# Patient Record
Sex: Male | Born: 1935 | Race: White | Hispanic: No | Marital: Married | Smoking: Former smoker
Health system: Southern US, Community
[De-identification: ages and names within clinical notes are randomized; demographics above are authoritative.]

## PROBLEM LIST (undated history)

## (undated) DIAGNOSIS — M5136 Other intervertebral disc degeneration, lumbar region: Secondary | ICD-10-CM

## (undated) DIAGNOSIS — I10 Essential (primary) hypertension: Secondary | ICD-10-CM

## (undated) DIAGNOSIS — N400 Enlarged prostate without lower urinary tract symptoms: Secondary | ICD-10-CM

## (undated) DIAGNOSIS — M4800 Spinal stenosis, site unspecified: Secondary | ICD-10-CM

## (undated) DIAGNOSIS — R350 Frequency of micturition: Secondary | ICD-10-CM

## (undated) DIAGNOSIS — E785 Hyperlipidemia, unspecified: Secondary | ICD-10-CM

## (undated) DIAGNOSIS — Z9889 Other specified postprocedural states: Secondary | ICD-10-CM

## (undated) DIAGNOSIS — M51369 Other intervertebral disc degeneration, lumbar region without mention of lumbar back pain or lower extremity pain: Secondary | ICD-10-CM

## (undated) DIAGNOSIS — R351 Nocturia: Secondary | ICD-10-CM

## (undated) DIAGNOSIS — R112 Nausea with vomiting, unspecified: Secondary | ICD-10-CM

## (undated) DIAGNOSIS — N320 Bladder-neck obstruction: Secondary | ICD-10-CM

## (undated) DIAGNOSIS — R3915 Urgency of urination: Secondary | ICD-10-CM

## (undated) HISTORY — PX: JOINT REPLACEMENT: SHX530

## (undated) HISTORY — PX: TRANSURETHRAL RESECTION OF PROSTATE: SHX73

## (undated) HISTORY — PX: CATARACT EXTRACTION W/ INTRAOCULAR LENS  IMPLANT, BILATERAL: SHX1307

## (undated) HISTORY — PX: EYE SURGERY: SHX253

## (undated) HISTORY — DX: Essential (primary) hypertension: I10

## (undated) HISTORY — PX: BACK SURGERY: SHX140

---
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
## 1971-07-06 DIAGNOSIS — R112 Nausea with vomiting, unspecified: Secondary | ICD-10-CM

## 1971-07-06 HISTORY — DX: Nausea with vomiting, unspecified: R11.2

## 1971-07-06 HISTORY — PX: OTHER SURGICAL HISTORY: SHX169

## 1994-07-05 HISTORY — PX: HEMIARTHROPLASTY SHOULDER FRACTURE: SUR653

## 1999-10-24 ENCOUNTER — Ambulatory Visit: Admission: RE | Admit: 1999-10-24 | Discharge: 1999-10-24 | Payer: Self-pay | Admitting: Otolaryngology

## 2000-07-05 HISTORY — PX: KNEE ARTHROSCOPY: SUR90

## 2000-09-27 ENCOUNTER — Ambulatory Visit (HOSPITAL_COMMUNITY): Admission: RE | Admit: 2000-09-27 | Discharge: 2000-09-27 | Payer: Self-pay | Admitting: Gastroenterology

## 2001-08-29 ENCOUNTER — Encounter: Payer: Self-pay | Admitting: Orthopedic Surgery

## 2001-09-04 ENCOUNTER — Inpatient Hospital Stay (HOSPITAL_COMMUNITY): Admission: RE | Admit: 2001-09-04 | Discharge: 2001-09-06 | Payer: Self-pay | Admitting: Orthopedic Surgery

## 2001-09-04 HISTORY — PX: TOTAL KNEE ARTHROPLASTY: SHX125

## 2006-11-14 DIAGNOSIS — D126 Benign neoplasm of colon, unspecified: Secondary | ICD-10-CM

## 2006-11-14 DIAGNOSIS — K5730 Diverticulosis of large intestine without perforation or abscess without bleeding: Secondary | ICD-10-CM | POA: Insufficient documentation

## 2007-04-10 ENCOUNTER — Ambulatory Visit: Payer: Self-pay | Admitting: Gastroenterology

## 2008-01-10 ENCOUNTER — Telehealth: Payer: Self-pay | Admitting: Gastroenterology

## 2008-01-17 ENCOUNTER — Ambulatory Visit: Payer: Self-pay | Admitting: Gastroenterology

## 2008-01-24 ENCOUNTER — Ambulatory Visit: Payer: Self-pay | Admitting: Gastroenterology

## 2008-01-24 ENCOUNTER — Encounter: Payer: Self-pay | Admitting: Gastroenterology

## 2008-01-29 ENCOUNTER — Encounter: Payer: Self-pay | Admitting: Gastroenterology

## 2009-05-08 ENCOUNTER — Inpatient Hospital Stay (HOSPITAL_COMMUNITY): Admission: RE | Admit: 2009-05-08 | Discharge: 2009-05-10 | Payer: Self-pay | Admitting: Orthopedic Surgery

## 2009-05-08 HISTORY — PX: LUMBAR FUSION: SHX111

## 2009-05-24 DIAGNOSIS — E785 Hyperlipidemia, unspecified: Secondary | ICD-10-CM | POA: Diagnosis present

## 2009-05-24 DIAGNOSIS — D61818 Other pancytopenia: Secondary | ICD-10-CM | POA: Diagnosis present

## 2009-06-02 DIAGNOSIS — I10 Essential (primary) hypertension: Secondary | ICD-10-CM | POA: Diagnosis present

## 2009-09-16 ENCOUNTER — Ambulatory Visit (HOSPITAL_COMMUNITY): Admission: RE | Admit: 2009-09-16 | Discharge: 2009-09-16 | Payer: Self-pay | Admitting: Orthopedic Surgery

## 2010-10-07 LAB — CBC
Hemoglobin: 11.7 g/dL — ABNORMAL LOW (ref 13.0–17.0)
MCV: 87.4 fL (ref 78.0–100.0)
RBC: 3.93 MIL/uL — ABNORMAL LOW (ref 4.22–5.81)
WBC: 5.4 10*3/uL (ref 4.0–10.5)

## 2010-10-07 LAB — BASIC METABOLIC PANEL
Calcium: 9.4 mg/dL (ref 8.4–10.5)
Chloride: 108 mEq/L (ref 96–112)
Creatinine, Ser: 1.08 mg/dL (ref 0.4–1.5)
GFR calc Af Amer: >60 mL/min (ref >60)
Sodium: 140 mEq/L (ref 135–145)

## 2010-10-07 LAB — ABO/RH: ABO/RH(D): O POS

## 2010-10-07 LAB — TYPE AND SCREEN
ABO/RH(D): O POS
Antibody Screen: NEGATIVE

## 2010-11-17 NOTE — Assessment & Plan Note (Signed)
[facility]                         GASTROENTEROLOGY OFFICE NOTE   Dennis Griffin                      MRN:          [id number]  DATE:04/10/2007                            DOB:          1931-03-20    PROBLEM:  Abdominal bulging.   Mr. Dennis Griffin is a pleasant 77 year old male here for evaluation of above.  He notices a knot that forms in his abdomen with abdominal wall  contraction such as that which occurs when he sits up.  He is without  pain.  Recent colonoscopy in May 2008 by Dr. Dorena Cookey apparently  demonstrated a colon polyp which was removed.  Dennis Griffin has no other  GI complaints including change of bowel habits, nausea, pyrosis, melena  or hematochezia.   PAST MEDICAL HISTORY:  Apparently is unremarkable.   FAMILY HISTORY:  Noncontributory.   MEDICATIONS:  Zocor, Procar, Cardura.   HE HAS NO ALLERGIES.   He neither smokes nor drinks.  He is married and retired.   REVIEW OF SYSTEMS:  Was reviewed and is negative as under HPI.   His colonoscopy report is now available and demonstrated a pedunculated  interstitial polyp in the proximal ascending colon measuring 20-30 mm in  size.  A 7 mm polyp in rectal sigmoid was also identified and removed.  The former was a tubular villous adenoma and the latter a tubular  adenoma.   PHYSICAL EXAMINATION:  Pulse 88, blood pressure 150/68, weight 185.  HEENT: EOMI.  PERRLA.  Sclerae are anicteric.  Conjunctivae are pink.  NECK:  Supple without thyromegaly, adenopathy or carotid bruits.  CHEST:  Clear to auscultation and percussion without adventitious  sounds.  CARDIAC:  Regular rhythm; normal S1 S2.  There are no murmurs, gallops  or rubs.  ABDOMEN:  There is a bulging in the area of the linea alba with  abdominal wall contraction.  There is no frank hernia.  There are no  abdominal masses or organomegaly.  EXTREMITIES:  Full range of motion.  No cyanosis, clubbing or edema.  RECTAL:   Deferred.   IMPRESSION:  1. Colonic polyposis.  2. Laxity in the anterior abdominal wall.  No frank hernia is seen.   RECOMMENDATION:  1. Followup colonoscopy in May 2009.  2. Patient was instructed not to do heavy lifting or straining which      could predispose him to an abdominal wall hernia.     Dennis Griffin. Dennis Dice, MD,FACG  Electronically Signed    RDK/MedQ  DD: 04/10/2007  DT: 04/10/2007  Job #: [id number]   cc:   Dennis Griffin, M.D.  Dennis Mormon, PA, [facility]

## 2010-11-20 NOTE — H&P (Signed)
[place]. [facility]  Patient:    Dennis Griffin, Dennis Griffin Visit Number: [id number] MRN: [id number]          Service Type: Attending:  Georgena Spurling, M.D. Dictated by:   Jamelle Rushing, P.A. Adm. Date:  09/04/01                           History and Physical  DATE OF BIRTH:  1930-08-24  CHIEF COMPLAINT:  Left knee pain and difficulty with range of motion.  HISTORY OF PRESENT ILLNESS:  The patient is a 77 year old white male with progressively worsening left knee pain.  Patient initially had difficulty with range of motion and pain, was evaluated by Dr. Thurston Hole, had an arthroscopic debridement of his knee approximately one year ago with some slight improvement.  Despite the arthroscopy the patient continued to have worsening pain and difficulty with range of motion.  He did have Synvisc injections with some improvement of his discomfort but his problems continued to progress. The patient states he has deep, aching sensation with any type of weightbearing activity.  He has stiffness in his knee after periods of sitting in rest.  He has difficulty fully extending his knee, making lying in bed very difficult.  His pain is predominantly along the medial joint line with no radiation.  He does have pain at night.  He does have popping, catching, and swelling in the knee and his knee has given out at times.  Patient denies any previous injury and he does not use any assistive device.  X-rays reveal severe medial joint line arthritis with findings from arthroscope of significant chondromalacia and degenerative changes throughout the knee.  DRUG ALLERGIES:  PENICILLIN.  CURRENT MEDICATIONS: 1. Lipitor 10 mg p.o. q.d. 2. Cardura 8 mg p.o. q.d. 3. Diclofenac 75 mg p.o. b.i.d. - will stop on this date.  PREVIOUS MEDICAL HISTORY: 1. Hypercholesterolemia. 2. BPH. 3. 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  PAST SURGICAL  HISTORY: 1. Tonsillectomy in 1942. 2. Broken arm with ORIF in 1973. 3. Right shoulder hemiarthroplasty in 1996. 4. Prostate surgery unknown date. 5. Left knee arthroscopy in 2002.  Patient denies any complications with any of the above-mentioned surgical procedures.  SOCIAL HISTORY:  Patient is a 77 year old, healthy-appearing, slightly heavyset white male.  Denies any history of smoking or alcohol use.  He is married with two grown children.  He lives in a [place] house and he is a retired Counsellor.  FAMILY PHYSICIAN:  Dr. Lupe Carney at [phone].  FAMILY MEDICAL HISTORY:  Both mother and father are deceased of old age. Patient has one brother deceased at 71 years of age from cardiac complications.  Patient has two brothers alive with no significant medical issues but they both smoke.  REVIEW OF SYSTEMS:  Positive for partial upper dentures.  He does use glasses at all times.  Otherwise, review of systems is negative for any other sensory, respiratory, cardiac, GI, GU, hematologic, musculoskeletal, neurologic, or mental status issues.  PHYSICAL EXAMINATION:  VITAL SIGNS:  Height 5 feet 9 inches, weight 195 pounds.  Pulse 60 and regular, respirations 16, temperature 98.0, blood pressure 172/92.  GENERAL:  This is a healthy-appearing, well-developed white male, slightly heavyset.  He ambulates with a slight left-sided limp.  He was able to get himself on and off the exam table without much difficulty other than has to stand a second and loosen  his knee up on the left before ambulating off down the hall.  HEENT:  Head is normocephalic, atraumatic, nontender over maxillary or frontal sinuses.  Pupils equal, round, and reactive, accommodating to light. Extraocular movements intact.  Sclerae are not icteric.  Conjunctivae are pink and moist.  External ears without deformities, canals patent, TMs pearly gray and intact.  Gross hearing is intact.  Nasal septum was slightly  deviated towards the left.  Mucous membranes pink and moist, no polyps noted.  Oral buccal mucosa was pink and moist without lesions.  Upper partial plates were in place.  The rest of the dentition was in fair repair.  Uvula was midline and moved symmetrically with phonation.  He was able to swallow and speak without any difficulties.  NECK:  Supple.  No palpable lymphadenopathy.  Thyroid gland was nontender. Patient had good range of motion of his cervical spine without any difficulty or tenderness.  CHEST:  Lung sounds were clear and equal bilaterally.  No wheezes, rales, rhonchi, or rubs noted.  HEART:  Regular rate and rhythm, S1 and S2 was auscultated.  No murmurs, rubs, or gallops noted.  ABDOMEN:  Round, soft, nontender.  Bowel sounds were present and normoactive throughout.  CVA was nontender to percussion.  No hepatosplenomegaly palpable.  EXTREMITIES:  Upper extremities were symmetrically sized and shaped.  Patient had a large well-healed anterior surgical incision on the right upper arm.  He had significant decreased range of motion of his right shoulder with abduction to 70 degrees, anterior elevation and extension was approximately 80 degrees, and full internal rotation and about 10 degrees external rotation due to a right shoulder hemiarthroplasty.  Left shoulder range of motion was full without any deficits.  Patient had good range of motion of his right and left elbows and wrists without any difficulty.  He had a right forearm surgical incision which was well healed.  Lower extremities:  Right and left hip had full range of motion without any deficits or mechanical symptoms.  Right knee with no sign or erythema or ecchymosis, no effusion, no medial or lateral joint line tenderness.  He did have slight crepitus on the patella with full range of motion which was from 0 degrees to 120 degrees.  No significant valgus-varus laxity, no anterior or posterior drawer.  The  right calf was nontender.  The left knee had no sign of  erythema or ecchymosis, had very small well-healed arthroscopic incisions. There was no significant palpable effusion but he was significantly tender along the medial joint line.  Range of motion had significant crepitus with 10 degrees short of full extension and flexion back to 110 degrees.  He had about 5 degrees valgus-varus laxity, no anterior or posterior drawer, and the calf was nontender.  Bilateral ankles were symmetrical with good dorsi and plantar flexion.  PERIPHERAL VASCULATURE:  Carotid pulses were 2+, no bruits.  Radial pulses 2+. Dorsalis pedis and posterior tibial pulses were 2+ with no significant lower extremity edema, pigmentation changes, or varicosities.  NEUROLOGIC:  Patient was conscious, alert, and appropriate, held an easy conversation with the examiner.  Cranial nerves II-XII were grossly intact. Deep tendon reflexes of the upper and lower extremities were brisk and symmetrical right to left.  Patient was grossly intact head to toe to light touch sensation.  IMPRESSION: 1. End-stage osteoarthritis, left knee medial compartment. 2. Hypercholesterolemia. 3. Benign prostatic hypertrophy.  PLAN:  The patient will be admitted to [facility] on September 04, 2001  under the care of Dr. Georgena Spurling.  The patient will undergo all routine labs and tests prior to having a left total knee arthroplasty.  The patient has not donated any blood.  The patient has been evaluated and cleared by Dr. Lupe Carney for this surgical procedure. Dictated by:   Jamelle Rushing, P.A. Attending:  Georgena Spurling, M.D. DD:  08/29/01 TD:  08/29/01 Job: [id number]

## 2010-11-20 NOTE — Discharge Summary (Signed)
[place]  Patient:    Dennis Griffin, Dennis Griffin Visit Number: [id number] MRN: [id number]          Service Type: SUR Location: 5000 [id number] Attending Physician:  Georgena Spurling Dictated by:   Arnoldo Morale, P.A. Admit Date:  09/04/2001 Discharge Date: 09/06/2001   CC:         Melvyn Neth D. Clovis Riley, M.D.   Discharge Summary  ADMISSION DIAGNOSES: 1. End-stage osteoarthritis, left knee. 2. Hypercholesterolemia. 3. Benign prostatic hypertrophy.  DISCHARGE DIAGNOSES: 1. End-stage osteoarthritis, left knee. 2. Acute blood loss anemia secondary to surgery. 3. Constipation. 4. Hypercholesterolemia. 5. Benign prostatic hypertrophy.  PROCEDURE:  On September 04, 2001, Dennis Griffin underwent a left total knee arthroplasty by Georgena Spurling, M.D.  COMPLICATIONS:  None.  CONSULTATIONS: 1. Physical therapy and occupational therapy consult on September 05, 2001. 2. Case management consult, September 06, 2001.  HISTORY OF PRESENT ILLNESS:  This 77 year old white male patient presented with a history of progressively worsening left knee pain.  He also complained of decreasing range of motion of the knee.  The pain is present with any weightbearing activities, and he is unable to fully extend his knee.  He did have arthroscopy a year ago, but that failed to relieve his pain.  He is still having any stiffness with any prolonged sitting.  The knee pops, catches, and swells, and has given way in the past.  It is kind of an aching type of pain without radiation, and it does keep him awake at night.  He has failed conservative treatment at this time and because of that, he is presenting for a left total knee arthroplasty.  HOSPITAL COURSE:  Dennis Griffin tolerated his surgical procedure well without immediate postoperative complications.  He was subsequently transferred to 5000.  On September 05, 2001, postop day 1, he was afebrile with vital signs stable.  The leg was neurovascularly intact  and dressing was intact without drainage.  His hemoglobin was 8.2, hematocrit 25.1, and that was monitored. He was asymptomatic at that time.  He was started on therapy per protocol.  On postoperative day 2 he was doing very well with his pain control.  He was tolerating CPM 0-100 degrees.  T-max was 99.5, vitals were stable.  He did have a positive Homans sign on his left leg, and a Doppler was obtained which was negative for DVT.  He did have some constipation, and that was treated with a laxative.  His hemoglobin was 7.6 with a hematocrit of 22.9, but he was asymptomatic.  He was felt to be stable from an orthopedic standpoint for discharge home, and he was discharged home later in the day.  DISCHARGE INSTRUCTIONS:  Diet:  He can resume his regular prehospitalization diet.  Activity:  He is to be out of bed, weightbearing as tolerated on the left leg with the use of the walker.  He is arranged for home CPM to range his knee from 0-110 degrees about six to eight hours a day.  He is to have home health physical therapy per [place].  Wound care:  He needs to keep his left knee incision clean and dry and may shower after no drainage from the wound for two days.  He is to notify Dr. Sherlean Foot of temperature greater than 101.5, chills, pain unrelieved by pain medications, or foul-smelling drainage from the wound.  DISCHARGE MEDICATIONS:  He can resume all his prehospitalization medications with additional medications including:  1. Lovenox 40 mg  subcu once a day for 12 days. 2. OxyContin 20 mg one tablet p.o. q.12h., 26 with no refill. 3. Percocet 5 mg one to two p.o. q.4h. p.r.n. for pain, 50 with no refill. 4. Trinsicon one tablet p.o. t.i.d. for one month.  FOLLOW-UP:  He needs to follow up with Dr. Sherlean Foot in our office on Tuesday, September 19, 2001, and needs to call [phone] to set up that appointment.  LABORATORY DATA:  Bilateral lower extremity Doppler done on September 12, 2001, showed no  evidence of DVT, superficial thrombosis, or Bakers cyst.  February 25, his hemoglobin was 11.4, hematocrit 34.1, platelets 142.  On March 4, hemoglobin 8.2, hematocrit 25.1, platelets 108.  On March 5, white count 6.7, hemoglobin 7.6, hematocrit 22.9, and platelets 101.  On September 05, 2001, glucose 111, calcium 8.1.  On March 5, calcium was 7.9.  All other laboratory studies were within normal limits. Dictated by:   Arnoldo Morale, P.A. Attending Physician:  Georgena Spurling DD:  09/25/01 TD:  09/26/01 Job: [id number]

## 2010-11-20 NOTE — Procedures (Signed)
[facility]  Patient:    Dennis Griffin, Dennis Griffin                      MRN: [id number] Proc. Date: 09/27/00 Adm. Date:  [id number] Attending:  Louie Bun CC:         Abran Cantor. Clovis Riley, M.D.   Procedure Report  PROCEDURE:  Colonoscopy.  INDICATION FOR PROCEDURE:  Heme positive stools.  DESCRIPTION OF PROCEDURE:  The patient was placed in the left lateral decubitus position then placed on the pulse monitor with continuous low flow oxygen delivered by nasal cannula. He was sedated with 60 mg IV Demerol and  5 mg IV Versed. The Olympus video colonoscope was inserted into the rectum and advanced to the cecum, confirmed by transillumination at McBurneys point and visualization of the ileocecal valve and appendiceal orifice. The prep was fairly good but there were several areas where there was some retained fecal matter on the walls that could not be washed off and I could thus not rule out lesions less than 1 cm in all areas.  Otherwise the cecum, ascending, and transverse colon appeared normal with no masses, polyps, diverticula or other mucosal abnormalities. Within the descending and sigmoid colon, there were a few scattered diverticula, no other abnormalities. The rectum appeared normal and retroflexed view of the anus revealed no obvious internal hemorrhoids. The colonoscope was then withdrawn and the patient returned to the recovery room in stable condition. The patient tolerated the procedure well and there were no immediate complications.  IMPRESSION:  Left sided diverticulosis otherwise normal colonoscopy.  PLAN:  Due to the limited prep will repeat Hemoccults in one month and consider whether further workup needed. DD:  09/27/00 TD:  09/27/00 Job: [id number]

## 2010-11-20 NOTE — Op Note (Signed)
[place]. [facility]  Patient:    Dennis Griffin, Dennis Griffin Visit Number: [id number] MRN: [id number]          Service Type: SUR Location: 5000 5025 [id number] Attending Physician:  Georgena Spurling Dictated by:   Georgena Spurling, M.D. Proc. Date: 09/04/01 Admit Date:  09/04/2001                             Operative Report  PREOPERATIVE DIAGNOSIS:  Left knee osteoarthritis.  POSTOPERATIVE DIAGNOSIS:  Left knee osteoarthritis.  OPERATION PERFORMED:  Left total knee arthroplasty.  SURGEON:  Georgena Spurling, M.D.  ASSISTANT:  ANESTHESIA:  INDICATIONS FOR PROCEDURE:  The patient is a 77 year old white male in otherwise good health with failure of conservative treatment for osteoarthritis of the knee.  Informed consent was obtained.  DESCRIPTION OF PROCEDURE:  The patient was laid supine and administered general endotracheal anesthesia.  Foley catheter was placed and the left lower extremity was prepped and draped in the usual sterile fashion.  A standard midline incision was made with a #10 blade.  A new blade was used to make a median parapatellar arthrotomy and then the patella was everted.  A synovectomy was performed.  The patella measured 26 mm in thickness and a 35 mm reamer was used to ream down to approximately 16 to 17 mm.  The excess bone was removed and the 35 template was used to drill the lug holes and then we measured with the prosthetic trial in place and also measured 25.5 mm.  I then developed a subperiosteal sleeve off the medial crest of the tibia around to and  through the semimembranosus tendon.  I then aligned the tibial guide at neutral posterior slope 90 degrees to the anatomic axis of the tibia and removed a portion of the tibia approximately 2 mm in thickness off the medial tibial plateau.  Once this was removed we turned our attention to the femur. We made an anterior medullary hole and placed an intramedullary guide set on 6 degrees of valgus,  pinned it into place, made our distal femoral cut.  These were greater than  9 mm in thickness, so I then drilled the epicondylar axis. The posterior condylar angle measured 2 degrees, so I placed a sizer block in place and measured a size F.  We then placed the F 4 in 1 cutting block on, secured it into place, made our anterior and posterior Chamfer cuts.  I then placed a lamina spreader in the lateral compartment and removed the ACL, PCL, medial meniscus and posterior condylar osteophytes.  I then placed it in the medial compartment and removed the posterior condylar osteophytes on the lateral meniscus.  Then stripped the posterior capsule off the back of the femur.  I then used a 10 mm spacer block and had good balance in flexion and extension.  At this point we finished the femur pinning the size F finishing guide into place and cutting the box and lug holes.  I then removed that finishing guide and placed a 6 tibial tray on and drilled and keeled that. Once we had placed a 6 trial F on the tibia, F trial on the femur, 10 mm polyethylene trial and a 35 mm patella, we had good flexion extension gap balance, good flexion and extension.  Drop and dangle was approximately 115 to 120 degrees and the patella tracked very well.  At this point we removed all the  components and irrigated copiously with the pulse lavage system.  We then cemented in the 35 mm patella first, size 6 tibial tray second, size F femoral component third, removed all excess cement and then placed in the real 10 degree polyethylene insert.  We then allowed the cement to harden.  We removed excess bone.  We let the tourniquet down, this was at 57 minutes.  We then closed the arthrotomy with interrupted #1 Vicryl.  We did leave a medium Hemovac deep to the arthrotomy.  We closed deep soft tissues with interrupted 0 Vicryl, then a subcuticular 2-0 Vicryl and skin staples placed in 90 degrees of flexion.  We then dressed the  wound with Adaptic, 4 x 4s, sterile Webril and ABD.  TOURNIQUET TIME:  57 minutes.  COMPLICATIONS:  None.  DRAINS:  One Hemovac.  ESTIMATED BLOOD LOSS:  300 cc. Dictated by:   Georgena Spurling, M.D. Attending Physician:  Georgena Spurling DD:  09/04/01 TD:  09/04/01 Job: [id number]

## 2011-03-03 ENCOUNTER — Encounter: Payer: Self-pay | Admitting: Gastroenterology

## 2011-03-17 ENCOUNTER — Encounter: Payer: Self-pay | Admitting: Gastroenterology

## 2011-03-24 ENCOUNTER — Encounter: Payer: Self-pay | Admitting: Gastroenterology

## 2011-03-24 ENCOUNTER — Ambulatory Visit (AMBULATORY_SURGERY_CENTER): Payer: Medicare Other | Admitting: *Deleted

## 2011-03-24 VITALS — Ht 69.0 in | Wt 186.7 lb

## 2011-03-24 DIAGNOSIS — Z1211 Encounter for screening for malignant neoplasm of colon: Secondary | ICD-10-CM

## 2011-03-24 MED ORDER — MOVIPREP 100 G PO SOLR: ORAL | Status: DC

## 2011-04-07 ENCOUNTER — Encounter: Payer: Self-pay | Admitting: Gastroenterology

## 2011-04-07 ENCOUNTER — Ambulatory Visit (AMBULATORY_SURGERY_CENTER): Payer: Medicare Other | Admitting: Gastroenterology

## 2011-04-07 DIAGNOSIS — Z1211 Encounter for screening for malignant neoplasm of colon: Secondary | ICD-10-CM

## 2011-04-07 DIAGNOSIS — Z86010 Personal history of colonic polyps: Secondary | ICD-10-CM

## 2011-04-07 DIAGNOSIS — K5730 Diverticulosis of large intestine without perforation or abscess without bleeding: Secondary | ICD-10-CM

## 2011-04-07 DIAGNOSIS — D126 Benign neoplasm of colon, unspecified: Secondary | ICD-10-CM

## 2011-04-07 MED ORDER — SODIUM CHLORIDE 0.9 % IV SOLN: 500.0000 mL | INTRAVENOUS | Status: DC

## 2011-04-07 NOTE — Patient Instructions (Signed)
Follow your discharge instructions on the blue and green instruction sheets.  Continue your medications.  Await pathology results.

## 2011-04-08 ENCOUNTER — Telehealth: Payer: Self-pay | Admitting: *Deleted

## 2011-04-08 NOTE — Telephone Encounter (Signed)

## 2011-04-26 ENCOUNTER — Emergency Department (HOSPITAL_COMMUNITY): Payer: Medicare Other

## 2011-04-26 ENCOUNTER — Emergency Department (HOSPITAL_COMMUNITY)
Admission: EM | Admit: 2011-04-26 | Discharge: 2011-04-26 | Disposition: A | Discharge status: Home / Self Care | Payer: Medicare Other | Attending: Emergency Medicine | Admitting: Emergency Medicine

## 2011-04-26 DIAGNOSIS — R209 Unspecified disturbances of skin sensation: Secondary | ICD-10-CM | POA: Insufficient documentation

## 2011-04-26 DIAGNOSIS — M25569 Pain in unspecified knee: Secondary | ICD-10-CM | POA: Insufficient documentation

## 2011-04-26 DIAGNOSIS — M25469 Effusion, unspecified knee: Secondary | ICD-10-CM | POA: Insufficient documentation

## 2011-09-07 ENCOUNTER — Other Ambulatory Visit: Payer: Self-pay | Admitting: Urology

## 2011-09-15 ENCOUNTER — Encounter (HOSPITAL_BASED_OUTPATIENT_CLINIC_OR_DEPARTMENT_OTHER): Payer: Self-pay | Admitting: *Deleted

## 2011-09-15 NOTE — Progress Notes (Signed)
NPO AFTER MN WITH EXCEPTION CLEAR LIQUIDS UNTIL 0830 (NO CREAM/MILK PRODUCTS) ARRIVES AT 1330. NEEDS ISTAT AND EKG. REVIEWED [facility] GUIDELINES, WILL BRING MEDS.

## 2011-09-20 ENCOUNTER — Encounter (HOSPITAL_BASED_OUTPATIENT_CLINIC_OR_DEPARTMENT_OTHER): Payer: Self-pay | Admitting: Anesthesiology

## 2011-09-20 ENCOUNTER — Ambulatory Visit (HOSPITAL_BASED_OUTPATIENT_CLINIC_OR_DEPARTMENT_OTHER): Payer: Medicare Other | Admitting: Anesthesiology

## 2011-09-20 ENCOUNTER — Encounter (HOSPITAL_BASED_OUTPATIENT_CLINIC_OR_DEPARTMENT_OTHER): Payer: Self-pay | Admitting: *Deleted

## 2011-09-20 ENCOUNTER — Encounter (HOSPITAL_BASED_OUTPATIENT_CLINIC_OR_DEPARTMENT_OTHER)
Admission: RE | Disposition: A | Discharge status: Home Health | Payer: Self-pay | Source: Ambulatory Visit | Attending: Urology

## 2011-09-20 ENCOUNTER — Ambulatory Visit (HOSPITAL_BASED_OUTPATIENT_CLINIC_OR_DEPARTMENT_OTHER)
Admission: RE | Admit: 2011-09-20 | Discharge: 2011-09-21 | Disposition: A | Discharge status: Home Health | Payer: Medicare Other | Source: Ambulatory Visit | Attending: Urology | Admitting: Urology

## 2011-09-20 ENCOUNTER — Other Ambulatory Visit: Payer: Self-pay

## 2011-09-20 DIAGNOSIS — N401 Enlarged prostate with lower urinary tract symptoms: Secondary | ICD-10-CM | POA: Insufficient documentation

## 2011-09-20 DIAGNOSIS — R39198 Other difficulties with micturition: Secondary | ICD-10-CM | POA: Insufficient documentation

## 2011-09-20 DIAGNOSIS — Z79899 Other long term (current) drug therapy: Secondary | ICD-10-CM | POA: Insufficient documentation

## 2011-09-20 DIAGNOSIS — R350 Frequency of micturition: Secondary | ICD-10-CM | POA: Insufficient documentation

## 2011-09-20 DIAGNOSIS — R3915 Urgency of urination: Secondary | ICD-10-CM | POA: Insufficient documentation

## 2011-09-20 DIAGNOSIS — N471 Phimosis: Secondary | ICD-10-CM | POA: Insufficient documentation

## 2011-09-20 DIAGNOSIS — N138 Other obstructive and reflux uropathy: Secondary | ICD-10-CM | POA: Insufficient documentation

## 2011-09-20 DIAGNOSIS — N320 Bladder-neck obstruction: Secondary | ICD-10-CM

## 2011-09-20 DIAGNOSIS — I10 Essential (primary) hypertension: Secondary | ICD-10-CM | POA: Insufficient documentation

## 2011-09-20 HISTORY — DX: Frequency of micturition: R35.0

## 2011-09-20 HISTORY — DX: Urgency of urination: R39.15

## 2011-09-20 HISTORY — DX: Other intervertebral disc degeneration, lumbar region without mention of lumbar back pain or lower extremity pain: M51.369

## 2011-09-20 HISTORY — PX: TRANSURETHRAL RESECTION OF PROSTATE: SHX73

## 2011-09-20 HISTORY — DX: Nocturia: R35.1

## 2011-09-20 HISTORY — DX: Bladder-neck obstruction: N32.0

## 2011-09-20 HISTORY — DX: Benign prostatic hyperplasia without lower urinary tract symptoms: N40.0

## 2011-09-20 HISTORY — DX: Spinal stenosis, site unspecified: M48.00

## 2011-09-20 HISTORY — DX: Other intervertebral disc degeneration, lumbar region: M51.36

## 2011-09-20 LAB — POCT I-STAT 4, (NA,K, GLUC, HGB,HCT)
Glucose, Bld: 94 mg/dL (ref 70–99)
HCT: 34 % — ABNORMAL LOW (ref 39.0–52.0)

## 2011-09-20 SURGERY — TRANSURETHRAL RESECTION OF THE PROSTATE WITH GYRUS INSTRUMENTS
Anesthesia: General | Site: Prostate | Wound class: Clean Contaminated

## 2011-09-20 MED ORDER — SIMVASTATIN 20 MG PO TABS: 20.0000 mg | ORAL_TABLET | Freq: Every evening | ORAL | Status: DC

## 2011-09-20 MED ORDER — MORPHINE SULFATE 2 MG/ML IJ SOLN
2.0000 mg | INTRAMUSCULAR | Status: DC | PRN
  Administered 2011-09-20: 2 mg via INTRAVENOUS

## 2011-09-20 MED ORDER — SODIUM CHLORIDE 0.9 % IV SOLN: INTRAVENOUS | Status: DC

## 2011-09-20 MED ORDER — SODIUM CHLORIDE 0.9 % IR SOLN
Status: DC | PRN
  Administered 2011-09-20: 6000 mL

## 2011-09-20 MED ORDER — LIDOCAINE HCL (CARDIAC) 20 MG/ML IV SOLN
INTRAVENOUS | Status: DC | PRN
  Administered 2011-09-20: 80 mg via INTRAVENOUS

## 2011-09-20 MED ORDER — BELLADONNA ALKALOIDS-OPIUM 16.2-60 MG RE SUPP: 1.0000 | Freq: Four times a day (QID) | RECTAL | Status: DC | PRN

## 2011-09-20 MED ORDER — ONDANSETRON HCL 4 MG/2ML IJ SOLN
INTRAMUSCULAR | Status: DC | PRN
  Administered 2011-09-20: 4 mg via INTRAVENOUS

## 2011-09-20 MED ORDER — GLYCOPYRROLATE 0.2 MG/ML IJ SOLN
INTRAMUSCULAR | Status: DC | PRN
  Administered 2011-09-20: 0.2 mg via INTRAVENOUS

## 2011-09-20 MED ORDER — BELLADONNA ALKALOIDS-OPIUM 16.2-60 MG RE SUPP
RECTAL | Status: DC | PRN
  Administered 2011-09-20: 1 via RECTAL

## 2011-09-20 MED ORDER — OXYCODONE HCL 5 MG PO TABS
5.0000 mg | ORAL_TABLET | ORAL | Status: DC | PRN
  Administered 2011-09-20 (×2): 5 mg via ORAL

## 2011-09-20 MED ORDER — DEXAMETHASONE SODIUM PHOSPHATE 4 MG/ML IJ SOLN
INTRAMUSCULAR | Status: DC | PRN
  Administered 2011-09-20: 10 mg via INTRAVENOUS

## 2011-09-20 MED ORDER — BACITRACIN-NEOMYCIN-POLYMYXIN 400-5-5000 EX OINT: 1.0000 "application " | TOPICAL_OINTMENT | Freq: Three times a day (TID) | CUTANEOUS | Status: DC | PRN

## 2011-09-20 MED ORDER — 0.9 % SODIUM CHLORIDE (POUR BTL) OPTIME
TOPICAL | Status: DC | PRN
  Administered 2011-09-20: 500 mL

## 2011-09-20 MED ORDER — FENTANYL CITRATE 0.05 MG/ML IJ SOLN
INTRAMUSCULAR | Status: DC | PRN
  Administered 2011-09-20: 25 ug via INTRAVENOUS
  Administered 2011-09-20: 50 ug via INTRAVENOUS
  Administered 2011-09-20: 25 ug via INTRAVENOUS
  Administered 2011-09-20: 50 ug via INTRAVENOUS

## 2011-09-20 MED ORDER — LACTATED RINGERS IV SOLN
INTRAVENOUS | Status: DC
  Administered 2011-09-20 (×3): via INTRAVENOUS

## 2011-09-20 MED ORDER — ACETAMINOPHEN 325 MG PO TABS: 650.0000 mg | ORAL_TABLET | ORAL | Status: DC | PRN

## 2011-09-20 MED ORDER — PROPOFOL 10 MG/ML IV EMUL
INTRAVENOUS | Status: DC | PRN
  Administered 2011-09-20: 250 mg via INTRAVENOUS

## 2011-09-20 MED ORDER — PROMETHAZINE HCL 25 MG/ML IJ SOLN: 6.2500 mg | INTRAMUSCULAR | Status: DC | PRN

## 2011-09-20 MED ORDER — FINASTERIDE 5 MG PO TABS: 2.5000 mg | ORAL_TABLET | Freq: Every day | ORAL | Status: DC

## 2011-09-20 MED ORDER — LOSARTAN POTASSIUM-HCTZ 100-12.5 MG PO TABS: 0.5000 | ORAL_TABLET | Freq: Every day | ORAL | Status: DC

## 2011-09-20 MED ORDER — DOXAZOSIN MESYLATE 4 MG PO TABS
4.0000 mg | ORAL_TABLET | Freq: Every day | ORAL | Status: DC
  Administered 2011-09-20: 4 mg via ORAL

## 2011-09-20 MED ORDER — KETOROLAC TROMETHAMINE 30 MG/ML IJ SOLN
15.0000 mg | Freq: Once | INTRAMUSCULAR | Status: AC | PRN
  Administered 2011-09-20: 30 mg via INTRAVENOUS

## 2011-09-20 MED ORDER — SENNOSIDES-DOCUSATE SODIUM 8.6-50 MG PO TABS
1.0000 | ORAL_TABLET | Freq: Two times a day (BID) | ORAL | Status: DC
  Administered 2011-09-20: 1 via ORAL

## 2011-09-20 MED ORDER — CIPROFLOXACIN IN D5W 400 MG/200ML IV SOLN
400.0000 mg | Freq: Two times a day (BID) | INTRAVENOUS | Status: AC
  Administered 2011-09-21: 400 mg via INTRAVENOUS

## 2011-09-20 MED ORDER — ACETAMINOPHEN 10 MG/ML IV SOLN
1000.0000 mg | Freq: Four times a day (QID) | INTRAVENOUS | Status: DC
  Administered 2011-09-20 – 2011-09-21 (×2): 1000 mg via INTRAVENOUS

## 2011-09-20 MED ORDER — FENTANYL CITRATE 0.05 MG/ML IJ SOLN: 25.0000 ug | INTRAMUSCULAR | Status: DC | PRN

## 2011-09-20 MED ORDER — EPHEDRINE SULFATE 50 MG/ML IJ SOLN
INTRAMUSCULAR | Status: DC | PRN
  Administered 2011-09-20: 15 mg via INTRAVENOUS

## 2011-09-20 MED ORDER — ONDANSETRON HCL 4 MG/2ML IJ SOLN: 4.0000 mg | INTRAMUSCULAR | Status: DC | PRN

## 2011-09-20 MED ORDER — CIPROFLOXACIN IN D5W 400 MG/200ML IV SOLN
400.0000 mg | INTRAVENOUS | Status: AC
  Administered 2011-09-20: 400 mg via INTRAVENOUS

## 2011-09-20 SURGICAL SUPPLY — 29 items
BAG DRAIN URO-CYSTO SKYTR STRL (DRAIN) ×2 IMPLANT
BAG DRN ANRFLXCHMBR STRAP LEK (BAG)
BAG DRN UROCATH (DRAIN) ×1
BAG URINE DRAINAGE (UROLOGICAL SUPPLIES) ×1 IMPLANT
BAG URINE LEG 19OZ MD ST LTX (BAG) IMPLANT
CANISTER SUCT LVC 12 LTR MEDI- (MISCELLANEOUS) ×1 IMPLANT
CATH FOLEY 3WAY 30CC 24FR (CATHETERS) ×2
CATH HEMA 3WAY 30CC 24FR COUDE (CATHETERS) ×1 IMPLANT
CATH HEMA 3WAY 30CC 24FR RND (CATHETERS) IMPLANT
CATH URTH STD 24FR FL 3W 2 (CATHETERS) ×1 IMPLANT
CLOTH BEACON ORANGE TIMEOUT ST (SAFETY) ×2 IMPLANT
DRAPE CAMERA CLOSED 9X96 (DRAPES) ×2 IMPLANT
ELECT BUTTON HF 24-28F 2 30DE (ELECTRODE) IMPLANT
ELECT LOOP MED HF 24F 12D CBL (CLIP) ×2 IMPLANT
ELECT REM PT RETURN 9FT ADLT (ELECTROSURGICAL)
ELECT RESECT VAPORIZE 12D CBL (ELECTRODE) ×2 IMPLANT
ELECTRODE REM PT RTRN 9FT ADLT (ELECTROSURGICAL) ×1 IMPLANT
EVACUATOR MICROVAS BLADDER (UROLOGICAL SUPPLIES) IMPLANT
GLOVE ECLIPSE 7.0 STRL STRAW (GLOVE) ×2 IMPLANT
GLOVE INDICATOR 6.5 STRL GRN (GLOVE) ×3 IMPLANT
GOWN SURGICAL LARGE (GOWNS) ×1 IMPLANT
GOWN SURGICAL XLG (GOWNS) ×1 IMPLANT
HOLDER FOLEY CATH W/STRAP (MISCELLANEOUS) ×1 IMPLANT
KIT ASPIRATION TUBING (SET/KITS/TRAYS/PACK) ×1 IMPLANT
PACK CYSTOSCOPY (CUSTOM PROCEDURE TRAY) ×2 IMPLANT
PLUG CATH AND CAP STER (CATHETERS) IMPLANT
SET ASPIRATION TUBING (TUBING) IMPLANT
SYR 30ML LL (SYRINGE) IMPLANT
SYRINGE IRR TOOMEY STRL 70CC (SYRINGE) ×2 IMPLANT

## 2011-09-20 NOTE — Discharge Instructions (Signed)
DISCHARGE INSTRUCTIONS FOR TURP ° °MEDICATIONS: ° °1. DO NOT RESUME YOUR ASPIRIN, WARFARIN, OR OTHER BLOOD THINNER FOR 1 WEEK. ° °2. Resume all your other meds from home, including finasteride and tamsulosin if you were taking them before. ° °ACTIVITY °1. No heavy lifting >10 pounds for 2 weeks °2. No sexual activity for 2 weeks °3. No strenuous activity for 2 weeks °4. No driving while on narcotic pain medications °5. Drink plenty of water °6. Continue to walk at home - you can still get blood clots when you are at  °home, so keep active, but don't over do it. °7. Your urine may have some blood in it - make sure you drink plenty of water,  °call or come to the ER immediately if you can't urinate at all °8. No straddle activity (such as riding a bike or a horse)for 3 weeks. ° °BATHING °1. You can shower. Do not take baths or submerge the catheter underwater. ° °CATHETER (If you are discharged with a catheter.) °1. Keep your catheter secured to your leg at all times with tape. °2. You may experience leakage of urine around your catheter- as long as the  °catheter continues to drain, this is normal.  If your catheter stops draining  °return to the ER, but do not let anyone other than a urologist replace your  °catheter. °3. You may also have blood in your urine, even after it has been clear for  °several days; you may even pass some small blood clots or other material.  This  °is normal as well.  If this happens, sit down and drink plenty of water to help  °make urine to flush out your bladder.  If the blood in your urine becomes worse  °after doing this, contact our office or return to the ER. °4. You may use the leg bag (small bag) during the day, but use the large bag at  °night. ° ° ° °SIGNS/SYMPTOMS TO CALL: °1. Please call us if you have a fever greater than 101.5, uncontrolled  °nausea/vomiting, uncontrolled pain, dizziness, unable to urinate,  °chest pain, shortness of breath, leg swelling, leg pain, or any  other concerns  °or questions. ° °You can reach us at [phone]. ° °  °

## 2011-09-20 NOTE — Transfer of Care (Signed)
Immediate Anesthesia Transfer of Care Note  Patient: Dennis Griffin  Procedure(s) Performed: Procedure(s) (LRB): TRANSURETHRAL RESECTION OF THE PROSTATE WITH GYRUS INSTRUMENTS (N/A)  Patient Location: Patient transported to PACU with oxygen via face mask at 4 Liters / Min  Anesthesia Type: General  Level of Consciousness: awake and alert   Airway & Oxygen Therapy: Patient Spontanous Breathing and Patient connected to face mask oxygen  Post-op Assessment: Report given to PACU RN and Post -op Vital signs reviewed and stable  Post vital signs: Reviewed and stable  Dentition: Teeth and oropharynx remain in pre-op condition  Complications: No apparent anesthesia complications

## 2011-09-20 NOTE — Brief Op Note (Signed)
09/20/2011  3:04 PM  PATIENT:  Dennis Griffin  78 y.o. male  PRE-OPERATIVE DIAGNOSIS: Bladder Neck Contracture,  Benign Prostatic Hypertrophy  POST-OPERATIVE DIAGNOSIS:  Bladder Neck Contracture,  Benign Prostatic Hypertrophy  PROCEDURE:  Procedure(s) (LRB): TRANSURETHRAL RESECTION OF Bladder neck contracture Transurethral resection of prostate Cystoscopy  SURGEON:  Surgeon(s) and Role:    * Milford Cage, MD - Primary  PHYSICIAN ASSISTANT:   ASSISTANTS: none   ANESTHESIA:   general  EBL:  Total I/O In: 1000 [I.V.:1000] Out: -   BLOOD ADMINISTERED:none  DRAINS: Urinary Catheter (Foley)   LOCAL MEDICATIONS USED:  NONE  SPECIMEN:  Source of Specimen:  prostate chips  DISPOSITION OF SPECIMEN:  PATHOLOGY  COUNTS:  YES  TOURNIQUET:  * No tourniquets in log *  DICTATION: .Other Dictation: Dictation Number [id number]  PLAN OF CARE: Admit for overnight observation  PATIENT DISPOSITION:  PACU - hemodynamically stable.   Delay start of Pharmacological VTE agent (>24hrs) due to surgical blood loss or risk of bleeding: yes

## 2011-09-20 NOTE — Anesthesia Procedure Notes (Signed)
Procedure Name: LMA Insertion Date/Time: 09/20/2011 2:19 PM Performed by: Fran Lowes Pre-anesthesia Checklist: Patient identified, Emergency Drugs available, Suction available and Patient being monitored Patient Re-evaluated:Patient Re-evaluated prior to inductionOxygen Delivery Method: Circle System Utilized Preoxygenation: Pre-oxygenation with 100% oxygen Intubation Type: IV induction Ventilation: Mask ventilation without difficulty LMA: LMA inserted LMA Size: 5.0 Number of attempts: 1 Airway Equipment and Method: bite block Placement Confirmation: positive ETCO2 Tube secured with: Tape Dental Injury: Teeth and Oropharynx as per pre-operative assessment

## 2011-09-20 NOTE — H&P (Signed)
Urology History and Physical Exam  CC: BPH  HPI: 78 year old male presents today for transurethral resection of prostate and transurethral incision of bladder neck contracture. He had a TURP in 1987.  He has lower urinary tract symptoms of frequency, weak stream, and urgency.  Urodynamics in 2007 revealed a low flow rate.  Cystoscopy in the office in February 2013 revealed significant re-growth of prostate tissue that was obstructing as well as a bladder neck contracture.  He has been on proscar and doxazosin.  We have discussed the risks, benefits, alternatives, and likelihood of achieving his goals.   PMH: Past Medical History  Diagnosis Date  . Hypertension   . DDD (degenerative disc disease), lumbar   . Spinal stenosis   . BPH (benign prostatic hypertrophy)   . BNC (bladder neck contracture)   . Frequency of urination   . Urgency of urination   . Nocturia     PSH: Past Surgical History  Procedure Date  . Lumbar fusion 05-08-2009    L5 - S1  . Total knee arthroplasty 09-04-2001    LEFT  . Tonsillectomy 1942  . Orif arm fx 1973  . Hemiarthroplasty shoulder fracture 1996    RIGHT--  TRAMATIC INJURY/ AVASCULAR NECROSIS  . Knee arthroscopy 2002    LEFT  . Transurethral resection of prostate 26 YRS AGO    AND REMOVAL OF PROSTATIC STONE  . Cataract extraction w/ intraocular lens  implant, bilateral     Allergies: Allergies  Allergen Reactions  . Penicillins Rash    Medications: No prescriptions prior to admission   1. Doxazosin Mesylate 8 MG Oral Tablet; TAKE 1 TABLET DAILY;  2. Finasteride 5 MG Oral Tablet; Take 1 tablet every day;  3. Lipitor 10 MG Oral Tablet;  4. Losartan Potassium-HCTZ 100-12.5 MG Oral Tablet;  5. Vitamin D TABS;  6. Zocor TABS; Therapy    Social History: History   Social History  . Marital Status: Married    Spouse Name: N/A    Number of Children: N/A  . Years of Education: N/A   Occupational History  . Not on file.   Social  History Main Topics  . Smoking status: Former Smoker -- 15 years    Types: Cigarettes    Quit date: 07/23/1964  . Smokeless tobacco: Never Used  . Alcohol Use: No  . Drug Use: No  . Sexually Active: Not on file   Other Topics Concern  . Not on file   Social History Narrative  . No narrative on file    Family History: History reviewed. No pertinent family history.  Review of Systems: Positive: None Negative: Chest pain, SOB, fever..  A further 10 point review of systems was negative except what is listed in the HPI.  Physical Exam:  General: No acute distress.  Awake. Head:  Normocephalic.  Atraumatic. ENT:  EOMI.  Mucous membranes moist Neck:  Supple.  No lymphadenopathy. CV:  S1 present. S2 present. Regular rate. Pulmonary: Equal effort bilaterally.  Clear to auscultation bilaterally. Abdomen: Soft.  Non- tender to palpation. Skin:  Normal turgor.  No visible rash. Extremity: No gross deformity of bilateral upper extremities.  No gross deformity of    bilateral lower extremities. Neurologic: Alert. Appropriate mood.   Studies:  No results found for this basename: HGB:2,WBC:2,PLT:2 in the last 72 hours  No results found for this basename: NA:2,K:2,CL:2,CO2:2,BUN:2,CREATININE:2,CALCIUM:2,MAGNESIUM:2,GFRNONAA:2,GFRAA:2 in the last 72 hours   No results found for this basename: PT:2,INR:2,APTT:2 in the last 72 hours  No components found with this basename: ABG:2    Assessment:  BPH with bladder neck contracture.  Plan: -To OR for TURP and TUR-BNC.

## 2011-09-20 NOTE — Anesthesia Postprocedure Evaluation (Signed)
  Anesthesia Post-op Note  Patient: Dennis Griffin  Procedure(s) Performed: Procedure(s) (LRB): TRANSURETHRAL RESECTION OF THE PROSTATE WITH GYRUS INSTRUMENTS (N/A)  Patient Location: PACU  Anesthesia Type: General  Level of Consciousness: awake and alert   Airway and Oxygen Therapy: Patient Spontanous Breathing  Post-op Pain: mild  Post-op Assessment: Post-op Vital signs reviewed, Patient's Cardiovascular Status Stable, Respiratory Function Stable, Patent Airway and No signs of Nausea or vomiting  Post-op Vital Signs: stable  Complications: No apparent anesthesia complications

## 2011-09-20 NOTE — Progress Notes (Signed)
Post-op check  Patient doing well. No pain. Urine is clear on slow CBI drip.  Filed Vitals:   09/20/11 1613  BP: 163/72  Pulse: 60  Temp: 97.1 F (36.2 C)  Resp: 16   Gen: NAD Abd: soft, NTTP, ND GU: foley draining clear fluid on slow CBI  A/P: TUR-Bladder neck contracture & TURP today -Continue CBI. -Will take patient off bedrest and allow him to ambulate tonight. -Re-evaluate in the morning for discharge home.

## 2011-09-20 NOTE — Anesthesia Preprocedure Evaluation (Signed)
Anesthesia Evaluation  Patient identified by MRN, date of birth, ID band Patient awake    Reviewed: Allergy & Precautions, H&P , NPO status , Patient's Chart, lab work & pertinent test results  Airway Mallampati: II TM Distance: <3 FB Neck ROM: Full    Dental No notable dental hx.    Pulmonary neg pulmonary ROS,  breath sounds clear to auscultation  Pulmonary exam normal       Cardiovascular hypertension, Rhythm:Regular Rate:Normal     Neuro/Psych negative neurological ROS  negative psych ROS   GI/Hepatic negative GI ROS, Neg liver ROS,   Endo/Other  negative endocrine ROS  Renal/GU negative Renal ROS  negative genitourinary   Musculoskeletal negative musculoskeletal ROS (+)   Abdominal   Peds negative pediatric ROS (+)  Hematology negative hematology ROS (+)   Anesthesia Other Findings   Reproductive/Obstetrics negative OB ROS                           Anesthesia Physical Anesthesia Plan  ASA: II  Anesthesia Plan: General   Post-op Pain Management:    Induction: Intravenous  Airway Management Planned: LMA  Additional Equipment:   Intra-op Plan:   Post-operative Plan:   Informed Consent: I have reviewed the patients History and Physical, chart, labs and discussed the procedure including the risks, benefits and alternatives for the proposed anesthesia with the patient or authorized representative who has indicated his/her understanding and acceptance.   Dental advisory given  Plan Discussed with: CRNA  Anesthesia Plan Comments:         Anesthesia Quick Evaluation

## 2011-09-21 ENCOUNTER — Encounter (HOSPITAL_BASED_OUTPATIENT_CLINIC_OR_DEPARTMENT_OTHER): Payer: Self-pay | Admitting: Urology

## 2011-09-21 MED ORDER — SENNOSIDES-DOCUSATE SODIUM 8.6-50 MG PO TABS: 1.0000 | ORAL_TABLET | Freq: Two times a day (BID) | ORAL | Status: DC

## 2011-09-21 MED ORDER — HYDROCODONE-ACETAMINOPHEN 5-325 MG PO TABS: 1.0000 | ORAL_TABLET | ORAL | Status: AC | PRN

## 2011-09-21 MED ORDER — CIPROFLOXACIN HCL 500 MG PO TABS: 500.0000 mg | ORAL_TABLET | Freq: Two times a day (BID) | ORAL | Status: AC

## 2011-09-21 MED ORDER — HYOSCYAMINE SULFATE 0.125 MG PO TABS: 0.1250 mg | ORAL_TABLET | ORAL | Status: AC | PRN

## 2011-09-21 NOTE — Discharge Summary (Signed)
Physician Discharge Summary  Patient ID: AUDY DAUPHINE MRN: [id number] DOB/AGE: [birth year] 78 y.o.  Admit date: 09/20/2011 Discharge date: 09/21/2011  Admission Diagnoses: Bladder neck contracture  Discharge Diagnoses:  Bladder neck contracture  Discharged Condition: good  Hospital Course:  Admitted following transurethral resection of bladder neck contracture with transurethral resection of the prostate for observation and continuous bladder irrigation (CBI). The CBI was turned off on POD#1 and his urine remained clear.  He was able to be discharged home with the catheter in place.  Consults: None  Significant Diagnostic Studies: None  Treatments: surgery: transurethral resection of bladder neck contracture with transurethral resection of the prostate  Discharge Exam: Blood pressure 150/70, pulse 56, temperature 97.3 F (36.3 C), temperature source Oral, resp. rate 16, height 5\' 9"  (1.753 m), weight 81.647 kg (180 lb), SpO2 97.00%. Refer to PE from progress note on date of discharge.  Disposition: 01-Home or Self Care  Discharge Orders    Future Orders Please Complete By Expires   Discharge patient        Medication List  As of 09/21/2011  6:15 AM   STOP taking these medications         MI-OMEGA PO         TAKE these medications         ciprofloxacin 500 MG tablet   Commonly known as: CIPRO   Take 1 tablet (500 mg total) by mouth 2 (two) times daily. Begin this antibiotic the day you are going to have your catheter removed.   Start taking on: 09/28/2011      doxazosin 8 MG tablet   Commonly known as: CARDURA   Take 4 mg by mouth at bedtime.      finasteride 5 MG tablet   Commonly known as: PROSCAR   Take 2.5 mg by mouth daily. Takes one half daily      HYDROcodone-acetaminophen 5-325 MG per tablet   Commonly known as: NORCO   Take 1-2 tablets by mouth every 4 (four) hours as needed for pain.      hyoscyamine 0.125 MG tablet   Commonly known as: LEVSIN,  ANASPAZ   Take 1 tablet (0.125 mg total) by mouth every 4 (four) hours as needed for cramping (bladder spasms).      losartan-hydrochlorothiazide 100-12.5 MG per tablet   Commonly known as: HYZAAR   Take 0.5 tablets by mouth daily.      senna-docusate 8.6-50 MG per tablet   Commonly known as: Senokot-S   Take 1 tablet by mouth 2 (two) times daily.      simvastatin 40 MG tablet   Commonly known as: ZOCOR   Take 20 mg by mouth every evening.           Follow-up Information    Follow up with Saunders Revel, PA on 09/28/2011. (9:45 am)    Contact information:   [address] [facility] [address] [facility] [address] [place] [address] [phone]          Signed: Milford Cage 09/21/2011, 6:15 AM

## 2011-09-21 NOTE — Op Note (Signed)
NAMEAUBRA, PAPPALARDO NO.:  [id number]  MEDICAL RECORD NO.:  [id number]  LOCATION:  [facility]                         FACILITY:  [facility]  PHYSICIAN:  Natalia Leatherwood, MD    DATE OF BIRTH:  [birth year]  DATE OF PROCEDURE:  09/20/2011 DATE OF DISCHARGE:  04/26/2011                              OPERATIVE REPORT   SURGEONS:  Natalia Leatherwood, MD.  ASSISTANT:  None.  PREOPERATIVE DIAGNOSIS:  Bladder neck contracture and residual benign prostatic hypertrophy.  POSTOP DIAGNOSIS:  Bladder neck contracture and residual benign prostatic hypertrophy.  PROCEDURES PERFORMED: 1. Cystoscopy. 2. Incision of bladder neck contracture. 3. Transurethral resection of residual prostatic growth.  SPECIMEN:  Prostatic chips sent for permanent pathology.  ESTIMATED BLOOD LOSS:  Minimal.  COMPLICATIONS:  None.  FINDINGS:  Bladder neck contracture as well as some residual prostatic growth.  DRAINS:  Foley catheter.  COMPLICATIONS:  None.  HISTORY OF PRESENT ILLNESS:  This is a pleasant 78 year old gentleman who has a history of transurethral resection of the prostate many years ago.  He presents to clinic with multiple voiding symptoms.  After workup and office cystoscopy revealed bladder neck contracture, we discussed the risks and benefits of transurethral resection of bladder neck contracture and resection of the residual bladder and the residual prostatic tissue.  He presents today for that elective procedure.  PROCEDURE IN DETAIL:  Informed consent was obtained.  The patient was taken to the operating room where he was placed in supine position.  IV antibiotics were infused.  General anesthesia was induced.  He was then placed in a dorsal lithotomy position making sure to pad all pertinent neurovascular pressure points appropriately.  Following this, his genitals were prepped and draped in usual fashion.  Then the visual obturator to the gyrus resectoscope was passed  through the urethra and into the bladder.  The ureteral orifices were identified bilaterally and then loop resectoscope was used to make an incision at the 6 o'clock position to the bladder neck contracture and carry it back into the prostatic urethra.  There was some remnant of the verumontanum that was present and therefore this was used as a landmark.  There was no dissection distal to this to avoid injury to the sphincter.  Following this, a combination of loop resection and plasma button resection were combined to create a good channel from the bladder neck into the prostatic urethra.  All prostate chips were removed and sent to permanent pathology.  After there was a good channel noted, there was resection of the remaining prostate tissue down to the prostatic capsule.  Hemostasis was achieved and then the bladder was irrigated out.  There were no more chips present.  After this was completed, a 3- way hematuria coude-tipped Foley catheter was placed into the urethra and into the bladder and 30 mL sterile water were placed into the balloon.  The bladder was irrigated and it irrigated clear.  He was started on continuous bladder irrigation.  It was noted that he did have very stenotic foreskin due to some phimosis.  This was reduced after it had been cleaned in the operating room with the prepping and draping,.  This remained  reduced when the procedure was done.  Patient tolerated the procedure well.  He will be kept overnight for continuous bladder irrigation.          ______________________________ Natalia Leatherwood, MD     DW/MEDQ  D:  09/20/2011  T:  09/21/2011  Job:  [id number]

## 2011-09-21 NOTE — Progress Notes (Signed)
Urology Progress Note  Subjective:     No acute urologic events overnight. Pain controlled. Urine remains clear with CBI turned off.  ROS: No Chest Pain  Objective:  Patient Vitals for the past 24 hrs:  BP Temp Temp src Pulse Resp SpO2  09/21/11 0500 150/70 mmHg 97.3 F (36.3 C) Oral 56  16  97 %  09/20/11 2225 141/66 mmHg 97.7 F (36.5 C) Oral 61  16  97 %  09/20/11 2013 137/64 mmHg 97.1 F (36.2 C) Oral 61  16  97 %  09/20/11 1834 139/66 mmHg 97.2 F (36.2 C) Oral 63  16  98 %  09/20/11 1613 163/72 mmHg 97.1 F (36.2 C) Oral 60  16  99 %  09/20/11 1600 141/68 mmHg - - 53  16  100 %  09/20/11 1545 149/68 mmHg - - 92  20  91 %  09/20/11 1530 135/74 mmHg - - 60  10  100 %  09/20/11 1515 131/55 mmHg - - 68  9  96 %  09/20/11 1507 122/55 mmHg 97 F (36.1 C) - 75  8  98 %  09/20/11 1345 153/61 mmHg 97.2 F (36.2 C) Oral 58  16  99 %    Physical Exam: General:  No acute distress, awake Cardiovascular:    [x]   S1/S2 present, RRR  []   Irregularly irregular Chest:  CTA-B Abdomen:               []  Soft, appropriately TTP  [x]  Soft, NTTP  []  Soft, appropriately TTP, incision(s) clean/dry/intact  Genitourinary: foley in place; foreskin is reduced Foley:  Draining clear urine; CBI turned off    I/O last 3 completed shifts: In: 2840 [P.O.:1440; I.V.:1300; IV Piggyback:100] Out: 100 [Urine:100]  Recent Labs  [facility] 09/20/11 1403   HGB 11.6*   WBC --   PLT --    Recent Labs  [facility] 09/20/11 1403   NA 143   K 4.0   CL --   CO2 --   BUN --   CREATININE --   CALCIUM --   GFRNONAA --   GFRAA --     No results found for this basename: PT:2,INR:2,APTT:2 in the last 72 hours   No components found with this basename: ABG:2    1  Assessment: Bladder neck contracture POD#1  Plan: -Discharge home   Natalia Leatherwood, MD [phone]

## 2012-01-25 ENCOUNTER — Ambulatory Visit (INDEPENDENT_AMBULATORY_CARE_PROVIDER_SITE_OTHER): Payer: Medicare Other | Admitting: Surgery

## 2012-03-09 ENCOUNTER — Other Ambulatory Visit: Payer: Self-pay | Admitting: Neurological Surgery

## 2012-03-09 DIAGNOSIS — M545 Low back pain: Secondary | ICD-10-CM

## 2012-03-14 ENCOUNTER — Ambulatory Visit
Admission: RE | Admit: 2012-03-14 | Discharge: 2012-03-14 | Disposition: A | Discharge status: Home / Self Care | Payer: Medicare Other | Source: Ambulatory Visit | Attending: Neurological Surgery | Admitting: Neurological Surgery

## 2012-03-14 VITALS — BP 106/50 | HR 50

## 2012-03-14 DIAGNOSIS — M545 Low back pain: Secondary | ICD-10-CM

## 2012-03-14 MED ORDER — DIAZEPAM 5 MG PO TABS
5.0000 mg | ORAL_TABLET | Freq: Once | ORAL | Status: AC
  Administered 2012-03-14: 5 mg via ORAL

## 2012-03-14 MED ORDER — ONDANSETRON HCL 4 MG/2ML IJ SOLN
4.0000 mg | Freq: Once | INTRAMUSCULAR | Status: AC
  Administered 2012-03-14: 4 mg via INTRAMUSCULAR

## 2012-03-14 MED ORDER — IOHEXOL 180 MG/ML  SOLN
16.0000 mL | Freq: Once | INTRAMUSCULAR | Status: AC | PRN
  Administered 2012-03-14: 16 mL via INTRATHECAL

## 2012-03-14 MED ORDER — MEPERIDINE HCL 100 MG/ML IJ SOLN
75.0000 mg | Freq: Once | INTRAMUSCULAR | Status: AC
  Administered 2012-03-14: 75 mg via INTRAMUSCULAR

## 2012-03-22 ENCOUNTER — Other Ambulatory Visit: Payer: Self-pay | Admitting: Neurological Surgery

## 2012-03-24 ENCOUNTER — Other Ambulatory Visit: Payer: Self-pay | Admitting: Neurological Surgery

## 2012-04-04 ENCOUNTER — Encounter (HOSPITAL_COMMUNITY): Payer: Self-pay | Admitting: Pharmacy Technician

## 2012-04-10 ENCOUNTER — Encounter (HOSPITAL_COMMUNITY)
Admission: RE | Admit: 2012-04-10 | Discharge: 2012-04-10 | Disposition: A | Discharge status: Home / Self Care | Payer: Medicare Other | Source: Ambulatory Visit | Attending: Neurological Surgery | Admitting: Neurological Surgery

## 2012-04-10 ENCOUNTER — Ambulatory Visit (HOSPITAL_COMMUNITY)
Admission: RE | Admit: 2012-04-10 | Discharge: 2012-04-10 | Disposition: A | Discharge status: Home / Self Care | Payer: Medicare Other | Source: Ambulatory Visit | Attending: Neurological Surgery | Admitting: Neurological Surgery

## 2012-04-10 ENCOUNTER — Encounter (HOSPITAL_COMMUNITY): Payer: Self-pay

## 2012-04-10 DIAGNOSIS — I517 Cardiomegaly: Secondary | ICD-10-CM | POA: Insufficient documentation

## 2012-04-10 DIAGNOSIS — M47814 Spondylosis without myelopathy or radiculopathy, thoracic region: Secondary | ICD-10-CM | POA: Insufficient documentation

## 2012-04-10 DIAGNOSIS — Z01812 Encounter for preprocedural laboratory examination: Secondary | ICD-10-CM | POA: Insufficient documentation

## 2012-04-10 DIAGNOSIS — Z01818 Encounter for other preprocedural examination: Secondary | ICD-10-CM | POA: Insufficient documentation

## 2012-04-10 HISTORY — DX: Other specified postprocedural states: Z98.890

## 2012-04-10 HISTORY — DX: Hyperlipidemia, unspecified: E78.5

## 2012-04-10 HISTORY — DX: Nausea with vomiting, unspecified: R11.2

## 2012-04-10 LAB — BASIC METABOLIC PANEL
BUN: 17 mg/dL (ref 6–23)
Creatinine, Ser: 1.36 mg/dL — ABNORMAL HIGH (ref 0.50–1.35)
GFR calc non Af Amer: 48 mL/min — ABNORMAL LOW (ref >90)
Glucose, Bld: 94 mg/dL (ref 70–99)
Potassium: 4.3 mEq/L (ref 3.5–5.1)

## 2012-04-10 LAB — CBC WITH DIFFERENTIAL/PLATELET
Basophils Absolute: 0 10*3/uL (ref 0.0–0.1)
Basophils Relative: 1 % (ref 0–1)
Eosinophils Absolute: 0.3 10*3/uL (ref 0.0–0.7)
Hemoglobin: 11.5 g/dL — ABNORMAL LOW (ref 13.0–17.0)
MCH: 29.2 pg (ref 26.0–34.0)
MCHC: 33.3 g/dL (ref 30.0–36.0)
Monocytes Absolute: 0.4 10*3/uL (ref 0.1–1.0)
Monocytes Relative: 8 % (ref 3–12)
Neutrophils Relative %: 52 % (ref 43–77)
RDW: 12.9 % (ref 11.5–15.5)

## 2012-04-10 LAB — TYPE AND SCREEN: ABO/RH(D): O POS

## 2012-04-10 LAB — SURGICAL PCR SCREEN
MRSA, PCR: NEGATIVE
Staphylococcus aureus: NEGATIVE

## 2012-04-10 LAB — PROTIME-INR: INR: 1.02 (ref 0.00–1.49)

## 2012-04-10 NOTE — Pre-Procedure Instructions (Signed)
20 Dennis Griffin  04/10/2012   Your procedure is scheduled on:  Wednesday, October 16  Report to [facility] at 0630 AM.  Call this number if you have problems the morning of surgery: [phone]   Remember:   Do not eat food or drink liquids:After Midnight.  .   Take these medicines the morning of surgery with A SIP OF WATER: Cardura,Hydrocodone,   Do not wear jewelry, make-up or nail polish.  Do not wear lotions, powders, or perfumes. You may wear deodorant.  Do not shave 48 hours prior to surgery. Men may shave face and neck.  Do not bring valuables to the hospital.  Contacts, dentures or bridgework may not be worn into surgery.  Leave suitcase in the car. After surgery it may be brought to your room.  For patients admitted to the hospital, checkout time is 11:00 AM the day of discharge.   Patients discharged the day of surgery will not be allowed to drive home.  Name and phone number of your driver: n/a  Special Instructions: Shower using CHG 2 nights before surgery and the night before surgery.  If you shower the day of surgery use CHG.  Use special wash - you have one bottle of CHG for all showers.  You should use approximately 1/3 of the bottle for each shower.   Please read over the following fact sheets that you were given: Pain Booklet, Coughing and Deep Breathing, Blood Transfusion Information, MRSA Information and Surgical Site Infection Prevention

## 2012-04-11 NOTE — Consult Note (Signed)
Anesthesia chart review: Patient is a 78 year old male scheduled for L3-4 decompression laminectomy, L3-5 posterior lateral fusion on 04/19/2012 by Dr. Yetta Barre. History includes former smoker, postoperative nausea/vomiting, hypertension, hyperlipidemia, BPH status post TURP 09/20/11, prior L5-S1 fusion '10, and left TKA '03.  PCP is Dr. Jarome Matin.  He denies prior cardiac testing and does not see a Cardiologist.  Labs noted.  Cr 1.36. H/H 11.5/34.5.(stable).  PLT 123.  PT/INR WNL.  T&S done.  Chest x-ray on 04/10/2012 no acute cardiopulmonary disease.  EKG on 09/20/11 showed SB @ 58 bpm, inferior infarct (age undetermined), non-specific anterior T wave abnormality.  His inferior leads appear stable and anterior T wave abnormality is less prominent since at least 05/08/09.  He has tolerated a lumber fusion and TURP since then.  No CV symptoms were documented at his PAT visit.  If remains asymptomatic from a CV standpoint then anticipate he can proceed as planned.  Shonna Chock, PA-C

## 2012-04-18 MED ORDER — VANCOMYCIN HCL IN DEXTROSE 1-5 GM/200ML-% IV SOLN
1000.0000 mg | INTRAVENOUS | Status: AC
  Administered 2012-04-19: 1000 mg via INTRAVENOUS
  Filled 2012-04-18: qty 200

## 2012-04-19 ENCOUNTER — Inpatient Hospital Stay (HOSPITAL_COMMUNITY)
Admission: RE | Admit: 2012-04-19 | Discharge: 2012-04-22 | DRG: 460 | Disposition: A | Discharge status: Home / Self Care | Payer: Medicare Other | Source: Ambulatory Visit | Attending: Neurological Surgery | Admitting: Neurological Surgery

## 2012-04-19 ENCOUNTER — Inpatient Hospital Stay (HOSPITAL_COMMUNITY): Payer: Medicare Other

## 2012-04-19 ENCOUNTER — Encounter (HOSPITAL_COMMUNITY)
Admission: RE | Disposition: A | Discharge status: Home / Self Care | Payer: Self-pay | Source: Ambulatory Visit | Attending: Neurological Surgery

## 2012-04-19 ENCOUNTER — Encounter (HOSPITAL_COMMUNITY): Payer: Self-pay | Admitting: Vascular Surgery

## 2012-04-19 ENCOUNTER — Inpatient Hospital Stay (HOSPITAL_COMMUNITY): Payer: Medicare Other | Admitting: Vascular Surgery

## 2012-04-19 ENCOUNTER — Encounter (HOSPITAL_COMMUNITY): Payer: Self-pay | Admitting: *Deleted

## 2012-04-19 DIAGNOSIS — M5126 Other intervertebral disc displacement, lumbar region: Secondary | ICD-10-CM | POA: Diagnosis present

## 2012-04-19 DIAGNOSIS — Z96659 Presence of unspecified artificial knee joint: Secondary | ICD-10-CM

## 2012-04-19 DIAGNOSIS — E785 Hyperlipidemia, unspecified: Secondary | ICD-10-CM | POA: Diagnosis present

## 2012-04-19 DIAGNOSIS — Z79899 Other long term (current) drug therapy: Secondary | ICD-10-CM

## 2012-04-19 DIAGNOSIS — Z981 Arthrodesis status: Secondary | ICD-10-CM

## 2012-04-19 DIAGNOSIS — M1990 Unspecified osteoarthritis, unspecified site: Secondary | ICD-10-CM | POA: Diagnosis present

## 2012-04-19 DIAGNOSIS — T84498A Other mechanical complication of other internal orthopedic devices, implants and grafts, initial encounter: Secondary | ICD-10-CM | POA: Diagnosis present

## 2012-04-19 DIAGNOSIS — I10 Essential (primary) hypertension: Secondary | ICD-10-CM | POA: Diagnosis present

## 2012-04-19 DIAGNOSIS — N400 Enlarged prostate without lower urinary tract symptoms: Secondary | ICD-10-CM | POA: Diagnosis present

## 2012-04-19 DIAGNOSIS — M48061 Spinal stenosis, lumbar region without neurogenic claudication: Principal | ICD-10-CM | POA: Diagnosis present

## 2012-04-19 DIAGNOSIS — Z87891 Personal history of nicotine dependence: Secondary | ICD-10-CM

## 2012-04-19 DIAGNOSIS — Y831 Surgical operation with implant of artificial internal device as the cause of abnormal reaction of the patient, or of later complication, without mention of misadventure at the time of the procedure: Secondary | ICD-10-CM | POA: Diagnosis present

## 2012-04-19 SURGERY — POSTERIOR LUMBAR FUSION 2 LEVEL
Anesthesia: General | Site: Back | Wound class: Clean

## 2012-04-19 MED ORDER — SODIUM CHLORIDE 0.9 % IV SOLN: 250.0000 mL | INTRAVENOUS | Status: DC

## 2012-04-19 MED ORDER — PHENYLEPHRINE HCL 10 MG/ML IJ SOLN
10.0000 mg | INTRAVENOUS | Status: DC | PRN
  Administered 2012-04-19: 20 ug/min via INTRAVENOUS

## 2012-04-19 MED ORDER — ARTIFICIAL TEARS OP OINT
TOPICAL_OINTMENT | OPHTHALMIC | Status: DC | PRN
  Administered 2012-04-19: 1 via OPHTHALMIC

## 2012-04-19 MED ORDER — ACETAMINOPHEN 10 MG/ML IV SOLN
1000.0000 mg | Freq: Four times a day (QID) | INTRAVENOUS | Status: AC
  Administered 2012-04-19 – 2012-04-20 (×4): 1000 mg via INTRAVENOUS
  Filled 2012-04-19 (×4): qty 100

## 2012-04-19 MED ORDER — ACETAMINOPHEN 10 MG/ML IV SOLN
INTRAVENOUS | Status: AC
  Administered 2012-04-19: 1000 mg via INTRAVENOUS
  Filled 2012-04-19: qty 100

## 2012-04-19 MED ORDER — ONDANSETRON HCL 4 MG/2ML IJ SOLN: 4.0000 mg | Freq: Once | INTRAMUSCULAR | Status: DC | PRN

## 2012-04-19 MED ORDER — NEOSTIGMINE METHYLSULFATE 1 MG/ML IJ SOLN
INTRAMUSCULAR | Status: DC | PRN
  Administered 2012-04-19: 5 mg via INTRAVENOUS

## 2012-04-19 MED ORDER — MORPHINE SULFATE 2 MG/ML IJ SOLN
1.0000 mg | INTRAMUSCULAR | Status: DC | PRN
  Administered 2012-04-19 – 2012-04-20 (×2): 2 mg via INTRAVENOUS
  Administered 2012-04-20: 4 mg via INTRAVENOUS
  Administered 2012-04-20 – 2012-04-21 (×2): 2 mg via INTRAVENOUS
  Filled 2012-04-19 (×3): qty 1
  Filled 2012-04-19: qty 2
  Filled 2012-04-19: qty 1

## 2012-04-19 MED ORDER — OXYCODONE HCL 5 MG PO TABS
5.0000 mg | ORAL_TABLET | Freq: Once | ORAL | Status: AC | PRN
  Administered 2012-04-19: 5 mg via ORAL

## 2012-04-19 MED ORDER — MENTHOL 3 MG MT LOZG: 1.0000 | LOZENGE | OROMUCOSAL | Status: DC | PRN

## 2012-04-19 MED ORDER — METHOCARBAMOL 100 MG/ML IJ SOLN
500.0000 mg | Freq: Four times a day (QID) | INTRAMUSCULAR | Status: DC | PRN
  Administered 2012-04-19: 500 mg via INTRAVENOUS
  Filled 2012-04-19: qty 5

## 2012-04-19 MED ORDER — ALBUMIN HUMAN 5 % IV SOLN
INTRAVENOUS | Status: DC | PRN
  Administered 2012-04-19: 10:00:00 via INTRAVENOUS

## 2012-04-19 MED ORDER — SODIUM CHLORIDE 0.9 % IV SOLN
INTRAVENOUS | Status: AC
  Filled 2012-04-19: qty 500

## 2012-04-19 MED ORDER — DEXAMETHASONE 4 MG PO TABS
4.0000 mg | ORAL_TABLET | Freq: Four times a day (QID) | ORAL | Status: DC
  Administered 2012-04-19 – 2012-04-22 (×12): 4 mg via ORAL
  Filled 2012-04-19 (×16): qty 1

## 2012-04-19 MED ORDER — BACITRACIN 50000 UNITS IM SOLR
INTRAMUSCULAR | Status: AC
  Filled 2012-04-19: qty 1

## 2012-04-19 MED ORDER — ONDANSETRON HCL 4 MG/2ML IJ SOLN: 4.0000 mg | INTRAMUSCULAR | Status: DC | PRN

## 2012-04-19 MED ORDER — DEXAMETHASONE SODIUM PHOSPHATE 10 MG/ML IJ SOLN: 10.0000 mg | INTRAMUSCULAR | Status: DC

## 2012-04-19 MED ORDER — PHENOL 1.4 % MT LIQD: 1.0000 | OROMUCOSAL | Status: DC | PRN

## 2012-04-19 MED ORDER — OXYCODONE HCL 5 MG/5ML PO SOLN: 5.0000 mg | Freq: Once | ORAL | Status: AC | PRN

## 2012-04-19 MED ORDER — DEXAMETHASONE SODIUM PHOSPHATE 4 MG/ML IJ SOLN
4.0000 mg | Freq: Four times a day (QID) | INTRAMUSCULAR | Status: DC
  Filled 2012-04-19 (×12): qty 1

## 2012-04-19 MED ORDER — POTASSIUM CHLORIDE IN NACL 20-0.9 MEQ/L-% IV SOLN
INTRAVENOUS | Status: DC
  Administered 2012-04-19 – 2012-04-20 (×2): via INTRAVENOUS
  Filled 2012-04-19 (×7): qty 1000

## 2012-04-19 MED ORDER — HYDROMORPHONE HCL PF 1 MG/ML IJ SOLN
INTRAMUSCULAR | Status: AC
  Filled 2012-04-19: qty 1

## 2012-04-19 MED ORDER — METHOCARBAMOL 500 MG PO TABS
500.0000 mg | ORAL_TABLET | Freq: Four times a day (QID) | ORAL | Status: DC | PRN
  Administered 2012-04-19: 500 mg via ORAL
  Filled 2012-04-19 (×2): qty 1

## 2012-04-19 MED ORDER — SODIUM CHLORIDE 0.9 % IJ SOLN
3.0000 mL | Freq: Two times a day (BID) | INTRAMUSCULAR | Status: DC
  Administered 2012-04-20 – 2012-04-21 (×2): 3 mL via INTRAVENOUS

## 2012-04-19 MED ORDER — HEMOSTATIC AGENTS (NO CHARGE) OPTIME
TOPICAL | Status: DC | PRN
  Administered 2012-04-19: 1 via TOPICAL

## 2012-04-19 MED ORDER — ACETAMINOPHEN 325 MG PO TABS: 650.0000 mg | ORAL_TABLET | ORAL | Status: DC | PRN

## 2012-04-19 MED ORDER — MIDAZOLAM HCL 5 MG/5ML IJ SOLN
INTRAMUSCULAR | Status: DC | PRN
  Administered 2012-04-19: 2 mg via INTRAVENOUS

## 2012-04-19 MED ORDER — PROPOFOL 10 MG/ML IV BOLUS
INTRAVENOUS | Status: DC | PRN
  Administered 2012-04-19: 100 mg via INTRAVENOUS

## 2012-04-19 MED ORDER — DOXAZOSIN MESYLATE 4 MG PO TABS
4.0000 mg | ORAL_TABLET | Freq: Every day | ORAL | Status: DC
  Administered 2012-04-19 – 2012-04-21 (×3): 4 mg via ORAL
  Filled 2012-04-19 (×4): qty 1

## 2012-04-19 MED ORDER — LOSARTAN POTASSIUM-HCTZ 100-12.5 MG PO TABS: 0.5000 | ORAL_TABLET | Freq: Every day | ORAL | Status: DC

## 2012-04-19 MED ORDER — MEPERIDINE HCL 25 MG/ML IJ SOLN: 6.2500 mg | INTRAMUSCULAR | Status: DC | PRN

## 2012-04-19 MED ORDER — THROMBIN 20000 UNITS EX SOLR
CUTANEOUS | Status: DC | PRN
  Administered 2012-04-19: 09:00:00 via TOPICAL

## 2012-04-19 MED ORDER — SODIUM CHLORIDE 0.9 % IR SOLN
Status: DC | PRN
  Administered 2012-04-19: 08:00:00

## 2012-04-19 MED ORDER — OXYCODONE HCL 5 MG PO TABS
ORAL_TABLET | ORAL | Status: AC
  Filled 2012-04-19: qty 1

## 2012-04-19 MED ORDER — LOSARTAN POTASSIUM 50 MG PO TABS
50.0000 mg | ORAL_TABLET | Freq: Every day | ORAL | Status: DC
  Administered 2012-04-20 – 2012-04-22 (×3): 50 mg via ORAL
  Filled 2012-04-19 (×3): qty 1

## 2012-04-19 MED ORDER — OXYCODONE-ACETAMINOPHEN 5-325 MG PO TABS
1.0000 | ORAL_TABLET | ORAL | Status: DC | PRN
  Administered 2012-04-19 – 2012-04-22 (×7): 2 via ORAL
  Filled 2012-04-19 (×7): qty 2

## 2012-04-19 MED ORDER — SODIUM CHLORIDE 0.9 % IJ SOLN: 3.0000 mL | INTRAMUSCULAR | Status: DC | PRN

## 2012-04-19 MED ORDER — BUPIVACAINE HCL (PF) 0.25 % IJ SOLN
INTRAMUSCULAR | Status: DC | PRN
  Administered 2012-04-19: 3 mL

## 2012-04-19 MED ORDER — VECURONIUM BROMIDE 10 MG IV SOLR
INTRAVENOUS | Status: DC | PRN
  Administered 2012-04-19: 2 mg via INTRAVENOUS
  Administered 2012-04-19 (×2): 1 mg via INTRAVENOUS

## 2012-04-19 MED ORDER — CEFAZOLIN SODIUM 1-5 GM-% IV SOLN
1.0000 g | Freq: Three times a day (TID) | INTRAVENOUS | Status: AC
  Administered 2012-04-19 – 2012-04-20 (×2): 1 g via INTRAVENOUS
  Filled 2012-04-19 (×2): qty 50

## 2012-04-19 MED ORDER — HYDROMORPHONE HCL PF 1 MG/ML IJ SOLN
0.2500 mg | INTRAMUSCULAR | Status: DC | PRN
  Administered 2012-04-19 (×4): 0.5 mg via INTRAVENOUS

## 2012-04-19 MED ORDER — PHENYLEPHRINE HCL 10 MG/ML IJ SOLN
INTRAMUSCULAR | Status: DC | PRN
  Administered 2012-04-19: 40 ug via INTRAVENOUS
  Administered 2012-04-19: 80 ug via INTRAVENOUS
  Administered 2012-04-19: 40 ug via INTRAVENOUS
  Administered 2012-04-19: 80 ug via INTRAVENOUS

## 2012-04-19 MED ORDER — SENNA 8.6 MG PO TABS
1.0000 | ORAL_TABLET | Freq: Two times a day (BID) | ORAL | Status: DC
  Administered 2012-04-19 – 2012-04-22 (×7): 8.6 mg via ORAL
  Filled 2012-04-19 (×8): qty 1

## 2012-04-19 MED ORDER — EPHEDRINE SULFATE 50 MG/ML IJ SOLN
INTRAMUSCULAR | Status: DC | PRN
  Administered 2012-04-19: 5 mg via INTRAVENOUS
  Administered 2012-04-19: 20 mg via INTRAVENOUS
  Administered 2012-04-19 (×2): 15 mg via INTRAVENOUS
  Administered 2012-04-19: 20 mg via INTRAVENOUS

## 2012-04-19 MED ORDER — ACETAMINOPHEN 650 MG RE SUPP: 650.0000 mg | RECTAL | Status: DC | PRN

## 2012-04-19 MED ORDER — 0.9 % SODIUM CHLORIDE (POUR BTL) OPTIME
TOPICAL | Status: DC | PRN
  Administered 2012-04-19: 1000 mL

## 2012-04-19 MED ORDER — GLYCOPYRROLATE 0.2 MG/ML IJ SOLN
INTRAMUSCULAR | Status: DC | PRN
  Administered 2012-04-19: .8 mg via INTRAVENOUS

## 2012-04-19 MED ORDER — LIDOCAINE HCL (CARDIAC) 20 MG/ML IV SOLN
INTRAVENOUS | Status: DC | PRN
  Administered 2012-04-19: 100 mg via INTRAVENOUS

## 2012-04-19 MED ORDER — HYDROCHLOROTHIAZIDE 10 MG/ML ORAL SUSPENSION
6.2500 mg | Freq: Every day | ORAL | Status: DC
  Administered 2012-04-20: 6.25 mg via ORAL
  Filled 2012-04-19 (×3): qty 1.25

## 2012-04-19 MED ORDER — DEXAMETHASONE SODIUM PHOSPHATE 10 MG/ML IJ SOLN
INTRAMUSCULAR | Status: AC
  Administered 2012-04-19: 10 mg via INTRAVENOUS
  Filled 2012-04-19: qty 1

## 2012-04-19 MED ORDER — ROCURONIUM BROMIDE 100 MG/10ML IV SOLN
INTRAVENOUS | Status: DC | PRN
  Administered 2012-04-19: 50 mg via INTRAVENOUS

## 2012-04-19 MED ORDER — LACTATED RINGERS IV SOLN
INTRAVENOUS | Status: DC | PRN
  Administered 2012-04-19 (×2): via INTRAVENOUS

## 2012-04-19 MED ORDER — FENTANYL CITRATE 0.05 MG/ML IJ SOLN
INTRAMUSCULAR | Status: DC | PRN
  Administered 2012-04-19: 150 ug via INTRAVENOUS
  Administered 2012-04-19 (×2): 50 ug via INTRAVENOUS

## 2012-04-19 SURGICAL SUPPLY — 60 items
APL SKNCLS STERI-STRIP NONHPOA (GAUZE/BANDAGES/DRESSINGS) ×1
BAG DECANTER FOR FLEXI CONT (MISCELLANEOUS) ×2 IMPLANT
BENZOIN TINCTURE PRP APPL 2/3 (GAUZE/BANDAGES/DRESSINGS) ×2 IMPLANT
BLADE SURG ROTATE 9660 (MISCELLANEOUS) IMPLANT
BUR MATCHSTICK NEURO 3.0 LAGG (BURR) ×2 IMPLANT
CANISTER SUCTION 2500CC (MISCELLANEOUS) ×2 IMPLANT
CLOTH BEACON ORANGE TIMEOUT ST (SAFETY) ×2 IMPLANT
CONT SPEC 4OZ CLIKSEAL STRL BL (MISCELLANEOUS) ×4 IMPLANT
COVER BACK TABLE 24X17X13 BIG (DRAPES) IMPLANT
COVER TABLE BACK 60X90 (DRAPES) ×2 IMPLANT
DRAPE C-ARM 42X72 X-RAY (DRAPES) ×4 IMPLANT
DRAPE LAPAROTOMY 100X72X124 (DRAPES) ×2 IMPLANT
DRAPE POUCH INSTRU U-SHP 10X18 (DRAPES) ×2 IMPLANT
DRAPE SURG 17X23 STRL (DRAPES) ×2 IMPLANT
DRESSING TELFA 8X3 (GAUZE/BANDAGES/DRESSINGS) ×2 IMPLANT
DRSG OPSITE 4X5.5 SM (GAUZE/BANDAGES/DRESSINGS) ×3 IMPLANT
DURAPREP 26ML APPLICATOR (WOUND CARE) ×2 IMPLANT
ELECT REM PT RETURN 9FT ADLT (ELECTROSURGICAL) ×2
ELECTRODE REM PT RTRN 9FT ADLT (ELECTROSURGICAL) ×1 IMPLANT
EVACUATOR 1/8 PVC DRAIN (DRAIN) ×2 IMPLANT
GAUZE SPONGE 4X4 16PLY XRAY LF (GAUZE/BANDAGES/DRESSINGS) IMPLANT
GLOVE BIO SURGEON STRL SZ8 (GLOVE) ×4 IMPLANT
GLOVE BIO SURGEON STRL SZ8.5 (GLOVE) ×1 IMPLANT
GLOVE BIOGEL PI IND STRL 7.5 (GLOVE) IMPLANT
GLOVE BIOGEL PI INDICATOR 7.5 (GLOVE) ×2
GLOVE OPTIFIT SS 6.5 STRL BRWN (GLOVE) ×3 IMPLANT
GLOVE OPTIFIT SS 8.0 STRL (GLOVE) ×1 IMPLANT
GOWN BRE IMP SLV AUR LG STRL (GOWN DISPOSABLE) IMPLANT
GOWN BRE IMP SLV AUR XL STRL (GOWN DISPOSABLE) ×4 IMPLANT
GOWN STRL REIN 2XL LVL4 (GOWN DISPOSABLE) IMPLANT
HEMOSTAT POWDER KIT SURGIFOAM (HEMOSTASIS) IMPLANT
KIT BASIN OR (CUSTOM PROCEDURE TRAY) ×2 IMPLANT
KIT ROOM TURNOVER OR (KITS) ×2 IMPLANT
MILL MEDIUM DISP (BLADE) ×1 IMPLANT
NDL HYPO 25X1 1.5 SAFETY (NEEDLE) ×1 IMPLANT
NEEDLE BONE MARROW 8GAX6 (NEEDLE) ×1 IMPLANT
NEEDLE HYPO 25X1 1.5 SAFETY (NEEDLE) ×2 IMPLANT
NS IRRIG 1000ML POUR BTL (IV SOLUTION) ×2 IMPLANT
PACK LAMINECTOMY NEURO (CUSTOM PROCEDURE TRAY) ×2 IMPLANT
PAD ARMBOARD 7.5X6 YLW CONV (MISCELLANEOUS) ×6 IMPLANT
PUTTY ABX ACTIFUSE 20ML (Putty) ×1 IMPLANT
ROD PRE-LORDOSED 35MM (Rod) ×2 IMPLANT
SCREW MODULAR NON CAN 6.5X45 (Screw) ×4 IMPLANT
SCREW SET SPINAL F/FIREBIRD (Screw) ×4 IMPLANT
SCREW SPINAL TOP LOAD PEDICLE ×4 IMPLANT
SPONGE GAUZE 4X4 12PLY (GAUZE/BANDAGES/DRESSINGS) ×1 IMPLANT
SPONGE LAP 4X18 X RAY DECT (DISPOSABLE) IMPLANT
SPONGE SURGIFOAM ABS GEL 100 (HEMOSTASIS) ×2 IMPLANT
STRIP CLOSURE SKIN 1/2X4 (GAUZE/BANDAGES/DRESSINGS) ×4 IMPLANT
SUT VIC AB 0 CT1 18XCR BRD8 (SUTURE) ×1 IMPLANT
SUT VIC AB 0 CT1 8-18 (SUTURE) ×2
SUT VIC AB 2-0 CP2 18 (SUTURE) ×2 IMPLANT
SUT VIC AB 3-0 SH 8-18 (SUTURE) ×4 IMPLANT
SYR 20ML ECCENTRIC (SYRINGE) ×2 IMPLANT
SYR CONTROL 10ML LL (SYRINGE) ×1 IMPLANT
TOWEL OR 17X24 6PK STRL BLUE (TOWEL DISPOSABLE) ×2 IMPLANT
TOWEL OR 17X26 10 PK STRL BLUE (TOWEL DISPOSABLE) ×2 IMPLANT
TRAP SPECIMEN MUCOUS 40CC (MISCELLANEOUS) IMPLANT
TRAY FOLEY CATH 14FRSI W/METER (CATHETERS) ×2 IMPLANT
WATER STERILE IRR 1000ML POUR (IV SOLUTION) ×2 IMPLANT

## 2012-04-19 NOTE — Op Note (Signed)
04/19/2012  11:35 AM  PATIENT:  Dennis Griffin  78 y.o. male  PRE-OPERATIVE DIAGNOSIS:  Severe adjacent level spinal stenosis L3-4 affecting both L3 and L4 nerve roots on the left, pseudoarthrosis L4-5  POST-OPERATIVE DIAGNOSIS:  Same  PROCEDURE:  1. Decompressive laminectomy, hemi-facetectomy and foraminotomies L3-4 for decompression of both the L3 and L4 nerve roots, 2. Intertransverse arthrodesis L3-L5 utilizing local autograft and morcellized allograft soaked with a bone marrow aspirate obtained through a separate fascial incision, 3. Nonsegmental fixation L3-4 utilizing Orthofix pedicle screws  SURGEON:  Marikay Alar, MD  ASSISTANTS: Dr. Lovell Sheehan  ANESTHESIA:   General  EBL: 200 ml  Total I/O In: 1850 [I.V.:1600; IV Piggyback:250] Out: 425 [Urine:225; Blood:200]  BLOOD ADMINISTERED:none  DRAINS: Medium Hemovac   SPECIMEN:  No Specimen  INDICATION FOR PROCEDURE: This patient had undergone a previous decompression and fusion from L4-S1 by another surgeon in the remote past. Presented with a left leg pain and weakness. I felt he had both L3 and L4 radiculopathies on the left. MRI and CT myelogram showed severe adjacent level stenosis L3-4 with a possible pseudoarthrosis L4-5. I recommended a decompression and instrumented fusion. Patient understood the risks, benefits, and alternatives and potential outcomes and wished to proceed.  PROCEDURE DETAILS:  The patient was brought to the operating room. After induction of generalized endotracheal anesthesia the patient was rolled into the prone position on chest rolls and all pressure points were padded. The patient's lumbar region was cleaned and then prepped with DuraPrep and draped in the usual sterile fashion. Anesthesia was injected and then a dorsal midline incision was made and carried down to the lumbosacral fascia. The fascia was opened and the paraspinous musculature was taken down in a subperiosteal fashion to expose L3-4 L4-5.  The interspinous plate D6-6 was obvious. The dissection out over the facets to expose the transverse processes of L3-L4 and L5 bilaterally. Intraoperative fluoroscopy confirmed my level, and the spinous process of L3 was removed and complete lumbar laminectomies, hemi- facetectomies, and foraminotomies were performed at L3-4. The yellow ligament was removed to expose the underlying dura and nerve roots, and generous foraminotomies were performed to adequately decompress the neural elements. I did find a herniated disc at L3-4 on the left. The epidural venous vasculature was coagulated and cut sharply. Disc space was incised and the initial discectomy was performed with pituitary rongeurs at L3-4 on the left. We then turned our attention to the posterior fixation. The pedicle screw entry zones were identified utilizing surface landmarks and fluoroscopy. We probed each pedicle with the pedicle probe and tapped each pedicle with the appropriate tap. We palpated with a ball probe to assure no break in the cortex. We then placed 6 5 x 45 mm pedicle screws into the pedicles bilaterally at L3-4. We then decorticated the transverse processes and laid a mixture of morcellized autograft and allograft out over these to perform intertransverse arthrodesis at L3-L5. We then placed lordotic rods into the multiaxial screw heads of the pedicle screws and locked these in position with the locking caps and anti-torque device. We then checked our construct with AP and lateral fluoroscopy. Irrigated with copious amounts of bacitracin-containing saline solution. Placed a medium Hemovac drain through separate stab incision. Inspected the nerve roots once again to assure adequate decompression, lined to the dura with Gelfoam, and closed the muscle and the fascia with 0 Vicryl. Closed the subcutaneous tissues with 2-0 Vicryl and subcuticular tissues with 3-0 Vicryl. The skin was closed with  benzoin and Steri-Strips. Dressing was then  applied, the patient was awakened from general anesthesia and transported to the recovery room in stable condition. At the end of the procedure all sponge, needle and instrument counts were correct.   PLAN OF CARE: Admit to inpatient   PATIENT DISPOSITION:  PACU - hemodynamically stable.   Delay start of Pharmacological VTE agent (>24hrs) due to surgical blood loss or risk of bleeding:  yes

## 2012-04-19 NOTE — Anesthesia Postprocedure Evaluation (Signed)
Anesthesia Post Note  Patient: Dennis Griffin  Procedure(s) Performed: Procedure(s) (LRB): POSTERIOR LUMBAR FUSION 2 LEVEL (N/A)  Anesthesia type: general  Patient location: PACU  Post pain: Pain level controlled  Post assessment: Patient's Cardiovascular Status Stable  Last Vitals:  Filed Vitals:   04/19/12 1225  BP: 149/100  Pulse: 65  Temp:   Resp: 18    Post vital signs: Reviewed and stable  Level of consciousness: sedated  Complications: No apparent anesthesia complications

## 2012-04-19 NOTE — Transfer of Care (Signed)
Immediate Anesthesia Transfer of Care Note  Patient: Dennis Griffin  Procedure(s) Performed: Procedure(s) (LRB) with comments: POSTERIOR LUMBAR FUSION 2 LEVEL (N/A) - Lumbar three-five posterior lumbar fusion ,pedicle screws lumbar three-four  Patient Location: PACU  Anesthesia Type: General  Level of Consciousness: awake, alert  and oriented  Airway & Oxygen Therapy: Patient Spontanous Breathing and Patient connected to face mask oxygen  Post-op Assessment: Report given to PACU RN, Post -op Vital signs reviewed and stable and Patient moving all extremities X 4  Post vital signs: Reviewed and stable  Complications: No apparent anesthesia complications

## 2012-04-19 NOTE — Anesthesia Preprocedure Evaluation (Addendum)
Anesthesia Evaluation  Patient identified by MRN, date of birth, ID band Patient awake    Reviewed: Allergy & Precautions, H&P , NPO status , Patient's Chart, lab work & pertinent test results, reviewed documented beta blocker date and time   History of Anesthesia Complications (+) PONV  Airway Mallampati: II TM Distance: >3 FB Neck ROM: Full    Dental  (+) Partial Upper and Dental Advisory Given   Pulmonary neg pulmonary ROS, former smoker,          Cardiovascular Exercise Tolerance: Good hypertension, Pt. on medications     Neuro/Psych  Neuromuscular disease (pain in the lower back and left LE) negative psych ROS   GI/Hepatic negative GI ROS, Neg liver ROS,   Endo/Other  negative endocrine ROS  Renal/GU negative Renal ROS     Musculoskeletal  (+) Arthritis -, Osteoarthritis,    Abdominal   Peds  Hematology negative hematology ROS (+)   Anesthesia Other Findings   Reproductive/Obstetrics                          Anesthesia Physical Anesthesia Plan  ASA: II  Anesthesia Plan: General   Post-op Pain Management:    Induction: Intravenous  Airway Management Planned: Oral ETT  Additional Equipment:   Intra-op Plan:   Post-operative Plan: Extubation in OR  Informed Consent: I have reviewed the patients History and Physical, chart, labs and discussed the procedure including the risks, benefits and alternatives for the proposed anesthesia with the patient or authorized representative who has indicated his/her understanding and acceptance.     Plan Discussed with: CRNA and Surgeon  Anesthesia Plan Comments:         Anesthesia Quick Evaluation

## 2012-04-19 NOTE — Progress Notes (Signed)
Orthopedic Tech Progress Note Patient Details:  Dennis Griffin Jan 04, 1931 [id number]  Patient ID: [name], male   DOB: 06/21/1931, 78 y.o.   MRN: [id number]   [name] 04/19/2012, 8:29 AM L-S SUPPORT WITH ANT. AND POST. PANELS COMPLETED BY [employer]

## 2012-04-19 NOTE — H&P (Signed)
Subjective: Patient is a 78 y.o. male admitted for LL and fusion L3-5. Onset of symptoms was several months ago, gradually worsening since that time.  The pain is rated moderate, and is located at the across the lower back and radiates to LLE. The pain is described as aching and occurs intermittently. The symptoms have been progressive. Symptoms are exacerbated by exercise. MRI or CT showed severe stenosis L3-4. 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   Past Medical History  Diagnosis Date  . Hypertension   . DDD (degenerative disc disease), lumbar   . Spinal stenosis   . BPH (benign prostatic hypertrophy)   . BNC (bladder neck contracture)   . Frequency of urination   . Urgency of urination   . Nocturia   . PONV (postoperative nausea and vomiting) 1973    after ORIF rt arm  . Hyperlipemia     Past Surgical History  Procedure Date  . Lumbar fusion 05-08-2009    L5 - S1  . Total knee arthroplasty 09-04-2001    LEFT  . Tonsillectomy 1942  . Orif arm fx 1973  . Hemiarthroplasty shoulder fracture 1996    RIGHT--  TRAMATIC INJURY/ AVASCULAR NECROSIS  . Knee arthroscopy 2002    LEFT  . Transurethral resection of prostate 26 YRS AGO    AND REMOVAL OF PROSTATIC STONE  . Cataract extraction w/ intraocular lens  implant, bilateral   . Transurethral resection of prostate 09/20/2011    Procedure: TRANSURETHRAL RESECTION OF THE PROSTATE WITH GYRUS INSTRUMENTS;  Surgeon: Milford Cage, MD;  Location: [facility];  Service: Urology;  Laterality: N/A;  . Joint replacement   . Back surgery   . Eye surgery     Prior to Admission medications   Medication Sig Start Date End Date Taking? Authorizing Provider  doxazosin (CARDURA) 8 MG tablet Take 4 mg by mouth at bedtime.  01/14/11  Yes Historical Provider, MD  HYDROcodone-acetaminophen (NORCO/VICODIN) 5-325 MG per tablet Take 1 tablet by mouth every 8 (eight) hours as needed. For pain   Yes  Historical Provider, MD  losartan-hydrochlorothiazide (HYZAAR) 100-12.5 MG per tablet Take 0.5 tablets by mouth daily.  02/08/11  Yes Historical Provider, MD  OVER THE COUNTER MEDICATION Take 1 capsule by mouth daily. Mega Red   Yes Historical Provider, MD  simvastatin (ZOCOR) 40 MG tablet Take 20 mg by mouth every evening.   Yes Historical Provider, MD   Allergies  Allergen Reactions  . Penicillins Rash    History  Substance Use Topics  . Smoking status: Former Smoker -- 15 years    Types: Cigarettes    Quit date: 07/23/1964  . Smokeless tobacco: Never Used  . Alcohol Use: No    History reviewed. No pertinent family history.   Review of Systems  Positive ROS: neg  All other systems have been reviewed and were otherwise negative with the exception of those mentioned in the HPI and as above.  Objective: Vital signs in last 24 hours: Temp:  [97.6 F (36.4 C)] 97.6 F (36.4 C) (10/16 0700) Pulse Rate:  [56] 56  (10/16 0700) Resp:  [16] 16  (10/16 0700) BP: (143)/(62) 143/62 mmHg (10/16 0700) SpO2:  [97 %] 97 % (10/16 0700)  General Appearance: Alert, cooperative, no distress, appears stated age Head: Normocephalic, without obvious abnormality, atraumatic Eyes: PERRL, conjunctiva/corneas clear, EOM's intact, fundi benign, both eyes      Ears: Normal TM's and external ear canals, both ears  Throat: Lips, mucosa, and tongue normal; teeth and gums normal Neck: Supple, symmetrical, trachea midline, no adenopathy; thyroid: No enlargement/tenderness/nodules; no carotid bruit or JVD Back: Symmetric, no curvature, ROM normal, no CVA tenderness Lungs: Clear to auscultation bilaterally, respirations unlabored Heart: Regular rate and rhythm, S1 and S2 normal, no murmur, rub or gallop Abdomen: Soft, non-tender, bowel sounds active all four quadrants, no masses, no organomegaly Extremities: Extremities normal, atraumatic, no cyanosis or edema Pulses: 2+ and symmetric all extremities Skin:  Skin color, texture, turgor normal, no rashes or lesions  NEUROLOGIC:   Mental status: Alert and oriented x4,  no aphasia, good attention span, fund of knowledge, and memory Motor Exam - grossly normal Sensory Exam - grossly normal Reflexes: 1+ Coordination - grossly normal Gait - grossly normal Balance - grossly normal Cranial Nerves: I: smell Not tested  II: visual acuity  OS: nl    OD: nl  II: visual fields Full to confrontation  II: pupils Equal, round, reactive to light  III,VII: ptosis None  III,IV,VI: extraocular muscles  Full ROM  V: mastication Normal  V: facial light touch sensation  Normal  V,VII: corneal reflex  Present  VII: facial muscle function - upper  Normal  VII: facial muscle function - lower Normal  VIII: hearing Not tested  IX: soft palate elevation  Normal  IX,X: gag reflex Present  XI: trapezius strength  5/5  XI: sternocleidomastoid strength 5/5  XI: neck flexion strength  5/5  XII: tongue strength  Normal    Data Review Lab Results  Component Value Date   WBC 4.6 04/10/2012   HGB 11.5* 04/10/2012   HCT 34.5* 04/10/2012   MCV 87.6 04/10/2012   PLT 123* 04/10/2012   Lab Results  Component Value Date   NA 140 04/10/2012   K 4.3 04/10/2012   CL 104 04/10/2012   CO2 27 04/10/2012   BUN 17 04/10/2012   CREATININE 1.36* 04/10/2012   GLUCOSE 94 04/10/2012   Lab Results  Component Value Date   INR 1.02 04/10/2012    Assessment/Plan: Patient admitted for LL/ fusion L3-5. Patient has failed conservative therapy.  I explained the condition and procedure to the patient and answered any questions.  Patient wishes to proceed with procedure as planned. Understands risks/ benefits and typical outcomes of procedure.   Alianny Toelle S 04/19/2012 8:29 AM

## 2012-04-20 MED ORDER — NAPHAZOLINE HCL 0.1 % OP SOLN
1.0000 [drp] | Freq: Four times a day (QID) | OPHTHALMIC | Status: DC | PRN
  Administered 2012-04-20: 1 [drp] via OPHTHALMIC
  Filled 2012-04-20: qty 15

## 2012-04-20 NOTE — Progress Notes (Signed)
Patient ID: Dennis Griffin, male   DOB: 1930/10/07, 78 y.o.   MRN: [id number] Subjective: Patient reports he's doing well. No leg pain.  Objective: Vital signs in last 24 hours: Temp:  [97 F (36.1 C)-98.7 F (37.1 C)] 98.2 F (36.8 C) (10/17 0600) Pulse Rate:  [65-93] 83  (10/17 0600) Resp:  [9-25] 18  (10/17 0600) BP: (110-158)/(53-111) 144/67 mmHg (10/17 0600) SpO2:  [94 %-99 %] 95 % (10/17 0600) FiO2 (%):  [2 %] 2 % (10/16 1851) Weight:  [90 kg (198 lb 6.6 oz)] 90 kg (198 lb 6.6 oz) (10/17 0200)  Intake/Output from previous day: 10/16 0701 - 10/17 0700 In: 1850 [I.V.:1600; IV Piggyback:250] Out: 3875 [Urine:3425; Drains:250; Blood:200] Intake/Output this shift:    Neurologic: Grossly normal  Lab Results: Lab Results  Component Value Date   WBC 4.6 04/10/2012   HGB 11.5* 04/10/2012   HCT 34.5* 04/10/2012   MCV 87.6 04/10/2012   PLT 123* 04/10/2012   Lab Results  Component Value Date   INR 1.02 04/10/2012   BMET Lab Results  Component Value Date   NA 140 04/10/2012   K 4.3 04/10/2012   CL 104 04/10/2012   CO2 27 04/10/2012   GLUCOSE 94 04/10/2012   BUN 17 04/10/2012   CREATININE 1.36* 04/10/2012   CALCIUM 9.7 04/10/2012    Studies/Results: Dg Lumbar Spine 2-3 Views  04/19/2012  *RADIOLOGY REPORT*  Clinical Data: L3-4 fusion  DG C-ARM 1-60 MIN,LUMBAR SPINE - 2-3 VIEW  Technique: C-arm fluoroscopy was utilized  Comparison:  CT myelogram 03/14/2012  Findings: X stop devices on the spinous processes at L4-5 and L5-S1 are unchanged from the prior study.  Trans pedicle screw fusion L3-4 has been performed since the prior study.  Hardware appears to be in satisfactory alignment.  There is disc degeneration at L4-5 and L5-S1.  IMPRESSION: Trans pedicle screw fusion L3-4 bilaterally.   Original Report Authenticated By: Camelia Phenes, M.D.    Dg C-arm 1-60 Min  04/19/2012  *RADIOLOGY REPORT*  Clinical Data: L3-4 fusion  DG C-ARM 1-60 MIN,LUMBAR SPINE - 2-3 VIEW  Technique: C-arm  fluoroscopy was utilized  Comparison:  CT myelogram 03/14/2012  Findings: X stop devices on the spinous processes at L4-5 and L5-S1 are unchanged from the prior study.  Trans pedicle screw fusion L3-4 has been performed since the prior study.  Hardware appears to be in satisfactory alignment.  There is disc degeneration at L4-5 and L5-S1.  IMPRESSION: Trans pedicle screw fusion L3-4 bilaterally.   Original Report Authenticated By: Camelia Phenes, M.D.     Assessment/Plan: Doing well, mobilize today.   LOS: 1 day    Tvisha Schwoerer S 04/20/2012, 7:54 AM

## 2012-04-20 NOTE — Evaluation (Signed)
Physical Therapy Evaluation Patient Details Name: KHALIF STENDER MRN: [id number] DOB: 01-02-1931 Today's Date: 04/20/2012 Time: 4098-1191 PT Time Calculation (min): 26 min  PT Assessment / Plan / Recommendation Clinical Impression  Mr. Madeira is 78 y/o male s/p lumbar fusion POD #1. Presents to PT today with decreased mobility secondary to pain and awareness of safe precautions following surgery. Will benefit physical therapy in the acute setting for education and gait training so as to maximize safety with gait when using an AD to insure safe d/c home. Likely no HHPT needs as long as pt can demonstrate safe technique with AD.     PT Assessment  Patient needs continued PT services    Follow Up Recommendations  No PT follow up    Does the patient have the potential to tolerate intense rehabilitation      Barriers to Discharge        Equipment Recommendations  Cane    Recommendations for Other Services     Frequency Min 5X/week    Precautions / Restrictions Precautions Precautions: Back Precaution Booklet Issued: Yes (comment) Required Braces or Orthoses: Spinal Brace Spinal Brace: Lumbar corset;Applied in sitting position Restrictions Weight Bearing Restrictions: No         Mobility  Bed Mobility Bed Mobility: Sit to Sidelying Right;Rolling Right;Rolling Left;Right Sidelying to Sit;Sitting - Scoot to [place] of Bed Rolling Right: 5: Supervision Rolling Left: 5: Supervision Right Sidelying to Sit: 5: Supervision Sitting - Scoot to Edge of Bed: 5: Supervision Sit to Sidelying Right: 5: Supervision Details for Bed Mobility Assistance: verbal cues for safe sequencing with use of log roll technique Transfers Transfers: Sit to Stand;Stand to Sit Sit to Stand: 5: Supervision;With upper extremity assist;From chair/3-in-1;From bed;4: Min assist;4: Min guard Stand to Sit: 5: Supervision;With upper extremity assist;To bed;To chair/3-in-1 Details for Transfer Assistance: cues for  safe hand placement; pt impulively standing up upon therapist entering the room with no assistive device present holding onto the rolling tray table needing cues for safety and given RW to stabilize himself, once RW or cane in place pt more supervision for transfers needing verbal cues throughout for safe technique with regards to hand placement and back precautions Ambulation/Gait Ambulation/Gait Assistance: 4: Min guard;5: Supervision Ambulation Distance (Feet): 450 Feet Assistive device: Rolling walker;Straight cane Ambulation/Gait Assistance Details: Ambulated approx 200 ft with RW needing several reminders to not take hands off RW when still walking as well as to not twist; pt ambulated another 250 ft with SPC needing mingaurdA with verbal cues for safe technique and sequence with cane General Gait Details: more stable with RW, good sequencing with cane however slight lateral sway/instability and tends to turn quickly and twist without notice at times throwing him slightly off balance             PT Diagnosis: Abnormality of gait;Acute pain  PT Problem List: Decreased knowledge of use of DME;Decreased safety awareness;Decreased mobility;Pain PT Treatment Interventions: DME instruction;Gait training;Functional mobility training;Therapeutic activities;Balance training;Neuromuscular re-education;Patient/family education   PT Goals Acute Rehab PT Goals PT Goal Formulation: With patient Time For Goal Achievement: 04/27/12 Potential to Achieve Goals: Good Pt will Roll Supine to Right Side: with modified independence PT Goal: Rolling Supine to Right Side - Progress: Goal set today Pt will Roll Supine to Left Side: with modified independence PT Goal: Rolling Supine to Left Side - Progress: Goal set today Pt will go Supine/Side to Sit: with modified independence PT Goal: Supine/Side to Sit - Progress: Goal set today  Pt will go Sit to Supine/Side: with modified independence PT Goal: Sit to  Supine/Side - Progress: Goal set today Pt will go Sit to Stand: with modified independence PT Goal: Sit to Stand - Progress: Goal set today Pt will go Stand to Sit: with modified independence PT Goal: Stand to Sit - Progress: Goal set today Pt will Transfer Bed to Chair/Chair to Bed: with modified independence PT Transfer Goal: Bed to Chair/Chair to Bed - Progress: Goal set today Pt will Ambulate: >150 feet;with modified independence;with least restrictive assistive device PT Goal: Ambulate - Progress: Goal set today Additional Goals Additional Goal #1: Pt will demonstrate and verbalize knowledge of 3/3 back precautions. PT Goal: Additional Goal #1 - Progress: Goal set today  Visit Information  Last PT Received On: 04/20/12 Assistance Needed: +1    Subjective Data  Subjective: I've been getting up to the bathroom Patient Stated Goal: home   Prior Functioning  Home Living Lives With: Spouse Available Help at Discharge: Family Type of Home: House Home Access: Level entry Home Layout: One level Bathroom Shower/Tub: Health visitor: Standard Home Adaptive Equipment: Bedside commode/3-in-1;Walker - rolling Prior Function Level of Independence: Independent Able to Take Stairs?: Yes Driving: Yes Communication Communication: No difficulties    Cognition  Overall Cognitive Status: Impaired Area of Impairment: Safety/judgement Arousal/Alertness: Awake/alert Orientation Level: Appears intact for tasks assessed Behavior During Session: [facility] for tasks performed Safety/Judgement: Impulsive    Extremity/Trunk Assessment Right Upper Extremity Assessment RUE ROM/Strength/Tone: WFL for tasks assessed RUE Sensation: WFL - Light Touch Left Upper Extremity Assessment LUE ROM/Strength/Tone: WFL for tasks assessed LUE Sensation: WFL - Light Touch Right Lower Extremity Assessment RLE ROM/Strength/Tone: WFL for tasks assessed RLE Sensation: WFL - Light Touch Left Lower  Extremity Assessment LLE ROM/Strength/Tone: WFL for tasks assessed LLE Sensation: WFL - Light Touch Trunk Assessment Trunk Assessment: Normal   Balance    End of Session PT - End of Session Equipment Utilized During Treatment: Gait belt Activity Tolerance: Patient tolerated treatment well Patient left: in chair;with call bell/phone within reach Nurse Communication: Mobility status  GP     [facility] HELEN 04/20/2012, 3:20 PM

## 2012-04-20 NOTE — Evaluation (Signed)
Occupational Therapy Evaluation Patient Details Name: Dennis Griffin MRN: [id number] DOB: 1931-05-27 Today's Date: 04/20/2012 Time: 4098-1191 OT Time Calculation (min): 26 min  OT Assessment / Plan / Recommendation Clinical Impression  Pt is recovering from lumbar fusion.  All education related to back precautions, safety, use of DME, and ADL completed.  No further OT needs.    OT Assessment  Patient does not need any further OT services    Follow Up Recommendations  No OT follow up    Barriers to Discharge      Equipment Recommendations  Cane    Recommendations for Other Services    Frequency       Precautions / Restrictions Precautions Precautions: Back Precaution Booklet Issued: Yes (comment) Required Braces or Orthoses: Spinal Brace Spinal Brace: Lumbar corset;Applied in sitting position Restrictions Weight Bearing Restrictions: No   Pertinent Vitals/Pain 3/10 back, premedicated, repositioned    ADL  Eating/Feeding: Simulated;Independent Where Assessed - Eating/Feeding: Chair Grooming: Simulated;Modified independent Where Assessed - Grooming: Unsupported standing Upper Body Bathing: Simulated;Set up Where Assessed - Upper Body Bathing: Unsupported sitting Lower Body Bathing: Simulated;Modified independent Where Assessed - Lower Body Bathing: Unsupported sit to stand Upper Body Dressing: Simulated;Set up Where Assessed - Upper Body Dressing: Unsupported sitting Lower Body Dressing: Performed;Modified independent Where Assessed - Lower Body Dressing: Unsupported sitting;Supported sit to stand Toilet Transfer: Simulated;Modified independent Toilet Transfer Method: Sit to Barista: Comfort height toilet Toileting - Clothing Manipulation and Hygiene: Simulated;Modified independent Where Assessed - Toileting Clothing Manipulation and Hygiene: Sit to stand from 3-in-1 or toilet Equipment Used: Back brace;Gait belt;Rolling  walker;Cane Transfers/Ambulation Related to ADLs: supervision for safety and to remind pt of back precautions ADL Comments: Pt able to bring his foot up for donning sock, washing feet.  Recommended pt use 3 in1 to sit when showering for enhanced safety.    OT Diagnosis:    OT Problem List:   OT Treatment Interventions:     OT Goals    Visit Information  Last OT Received On: 04/20/12 Assistance Needed: +1 PT/OT Co-Evaluation/Treatment: Yes    Subjective Data  Subjective: "I just pull my foot up and lean against the wall when I take a shower." Patient Stated Goal: Return home.   Prior Functioning     Home Living Lives With: Spouse Available Help at Discharge: Family Type of Home: House Home Access: Level entry Home Layout: One level Bathroom Shower/Tub: Health visitor: Standard Home Adaptive Equipment: Bedside commode/3-in-1;Walker - rolling Prior Function Level of Independence: Independent Able to Take Stairs?: Yes Driving: Yes Communication Communication: No difficulties Dominant Hand: Right         Vision/Perception     Cognition  Overall Cognitive Status: Impaired Area of Impairment: Safety/judgement Arousal/Alertness: Awake/alert Orientation Level: Appears intact for tasks assessed Behavior During Session: [facility] for tasks performed Safety/Judgement: Impulsive    Extremity/Trunk Assessment Right Upper Extremity Assessment RUE ROM/Strength/Tone: WFL for tasks assessed RUE Sensation: WFL - Light Touch Left Upper Extremity Assessment LUE ROM/Strength/Tone: WFL for tasks assessed LUE Sensation: WFL - Light Touch Right Lower Extremity Assessment RLE ROM/Strength/Tone: WFL for tasks assessed RLE Sensation: WFL - Light Touch Left Lower Extremity Assessment LLE ROM/Strength/Tone: WFL for tasks assessed LLE Sensation: WFL - Light Touch Trunk Assessment Trunk Assessment: Normal     Mobility Bed Mobility Bed Mobility: Sit to Sidelying  Right;Rolling Right;Rolling Left;Right Sidelying to Sit;Sitting - Scoot to Edge of Bed Rolling Right: 5: Supervision Rolling Left: 5: Supervision Right Sidelying to  Sit: 5: Supervision Sitting - Scoot to Edge of Bed: 5: Supervision Sit to Sidelying Right: 5: Supervision Details for Bed Mobility Assistance: verbal cues for safe sequencing with use of log roll technique Transfers Transfers: Sit to Stand;Stand to Sit Sit to Stand: 5: Supervision;With upper extremity assist;From chair/3-in-1;From bed;4: Min assist;4: Min guard Stand to Sit: 5: Supervision;With upper extremity assist;To bed;To chair/3-in-1 Details for Transfer Assistance: cues for safe hand placement; pt impulively standing up upon therapist entering the room with no assistive device present holding onto the rolling tray table needing cues for safety and given RW to stabilize himself, once RW or cane in place pt more supervision for mobility needing verbal cues throughout for safe technique with regards to hand placement and back precautions     Shoulder Instructions     Exercise     Balance     End of Session OT - End of Session Activity Tolerance: Patient tolerated treatment well Patient left: in chair;with call bell/phone within reach  GO     Evern Bio 04/20/2012, 3:37 PM [phone]

## 2012-04-21 MED FILL — Heparin Sodium (Porcine) Inj 1000 Unit/ML: INTRAMUSCULAR | Qty: 30 | Status: AC

## 2012-04-21 MED FILL — Sodium Chloride Irrigation Soln 0.9%: Qty: 3000 | Status: AC

## 2012-04-21 MED FILL — Sodium Chloride IV Soln 0.9%: INTRAVENOUS | Qty: 1000 | Status: AC

## 2012-04-21 NOTE — Progress Notes (Signed)
Physical Therapy Treatment Patient Details Name: Dennis Griffin MRN: [id number] DOB: [birth year] Today's Date: 04/21/2012 Time: 1208-1218 PT Time Calculation (min): 10 min  PT Assessment / Plan / Recommendation Comments on Treatment Session  Moving much safer today using RW. Able recall back precautions by looking at the back hand out. No further acute PT needs at this time. Verbalizing safe technique with log rolling for bed mobility.     Follow Up Recommendations  No PT follow up     Does the patient have the potential to tolerate intense rehabilitation     Barriers to Discharge        Equipment Recommendations  None recommended by PT    Recommendations for Other Services    Frequency     Plan Discharge plan remains appropriate;All goals met and education completed, patient dischaged from PT services    Precautions / Restrictions Precautions Precautions: Back;Fall Precaution Booklet Issued: Yes (comment) Spinal Brace: Applied in sitting position;Lumbar corset       Mobility  Transfers Transfers: Sit to Stand;Stand to Sit Sit to Stand: With upper extremity assist;6: Modified independent (Device/Increase time) Stand to Sit: 6: Modified independent (Device/Increase time);With upper extremity assist Details for Transfer Assistance: cues for safe hand placement initially but good carry over into activity Ambulation/Gait Ambulation/Gait Assistance: 6: Modified independent (Device/Increase time) Ambulation Distance (Feet): 400 Feet Assistive device: Rolling walker Ambulation/Gait Assistance Details: pt demonstrating improved safety with RW including maintaining hands on RW during all gait, reinforced safety cues for ambulation with RW and back precautions Gait Pattern: Within Functional Limits Gait velocity: slower more appropriate speed today General Gait Details: no sway or LOB noted today      PT Goals Acute Rehab PT Goals PT Goal: Sit to Stand - Progress: Met PT  Goal: Stand to Sit - Progress: Met PT Transfer Goal: Bed to Chair/Chair to Bed - Progress: Met PT Goal: Ambulate - Progress: Met Additional Goals PT Goal: Additional Goal #1 - Progress: Partly met  Visit Information  Last PT Received On: 04/21/12 Assistance Needed: +1    Subjective Data  Subjective: The doctor said I could go home tomorrow.    Cognition  Overall Cognitive Status: Appears within functional limits for tasks assessed/performed Arousal/Alertness: Awake/alert Orientation Level: Appears intact for tasks assessed Behavior During Session: [facility] for tasks performed Safety/Judgement: Decreased awareness of safety precautions    Balance     End of Session PT - End of Session Equipment Utilized During Treatment: Gait belt Activity Tolerance: Patient tolerated treatment well Patient left: in bed;with call bell/phone within reach (sitting EOB) Nurse Communication: Mobility status   GP     [facility] HELEN 04/21/2012, 1:39 PM

## 2012-04-21 NOTE — Progress Notes (Signed)
Pt said he didn't need DME

## 2012-04-21 NOTE — Progress Notes (Signed)
Patient ID: Dennis Griffin, male   DOB: 08-14-1930, 78 y.o.   MRN: [id number] Subjective: Patient reports he's doing well. No leg pain.   Objective: Vital signs in last 24 hours: Temp:  [98 F (36.7 C)-98.6 F (37 C)] 98 F (36.7 C) (10/18 0552) Pulse Rate:  [58-94] 58  (10/18 0552) Resp:  [18-20] 18  (10/18 0552) BP: (126-152)/(49-79) 131/49 mmHg (10/18 0552) SpO2:  [95 %-98 %] 96 % (10/18 0552)  Intake/Output from previous day: 10/17 0701 - 10/18 0700 In: -  Out: 100 [Drains:100] Intake/Output this shift:    Neurologic: Grossly normal  Lab Results: Lab Results  Component Value Date   WBC 4.6 04/10/2012   HGB 11.5* 04/10/2012   HCT 34.5* 04/10/2012   MCV 87.6 04/10/2012   PLT 123* 04/10/2012   Lab Results  Component Value Date   INR 1.02 04/10/2012   BMET Lab Results  Component Value Date   NA 140 04/10/2012   K 4.3 04/10/2012   CL 104 04/10/2012   CO2 27 04/10/2012   GLUCOSE 94 04/10/2012   BUN 17 04/10/2012   CREATININE 1.36* 04/10/2012   CALCIUM 9.7 04/10/2012    Studies/Results: Dg Lumbar Spine 2-3 Views  04/19/2012  *RADIOLOGY REPORT*  Clinical Data: L3-4 fusion  DG C-ARM 1-60 MIN,LUMBAR SPINE - 2-3 VIEW  Technique: C-arm fluoroscopy was utilized  Comparison:  CT myelogram 03/14/2012  Findings: X stop devices on the spinous processes at L4-5 and L5-S1 are unchanged from the prior study.  Trans pedicle screw fusion L3-4 has been performed since the prior study.  Hardware appears to be in satisfactory alignment.  There is disc degeneration at L4-5 and L5-S1.  IMPRESSION: Trans pedicle screw fusion L3-4 bilaterally.   Original Report Authenticated By: Camelia Phenes, M.D.    Dg C-arm 1-60 Min  04/19/2012  *RADIOLOGY REPORT*  Clinical Data: L3-4 fusion  DG C-ARM 1-60 MIN,LUMBAR SPINE - 2-3 VIEW  Technique: C-arm fluoroscopy was utilized  Comparison:  CT myelogram 03/14/2012  Findings: X stop devices on the spinous processes at L4-5 and L5-S1 are unchanged from the prior  study.  Trans pedicle screw fusion L3-4 has been performed since the prior study.  Hardware appears to be in satisfactory alignment.  There is disc degeneration at L4-5 and L5-S1.  IMPRESSION: Trans pedicle screw fusion L3-4 bilaterally.   Original Report Authenticated By: Camelia Phenes, M.D.     Assessment/Plan: Doing great.Home in am.   LOS: 2 days    [name] 04/21/2012, 8:08 AM

## 2012-04-22 MED ORDER — OXYCODONE-ACETAMINOPHEN 5-325 MG PO TABS: 1.0000 | ORAL_TABLET | Freq: Four times a day (QID) | ORAL | Status: DC | PRN

## 2012-04-22 MED ORDER — METHOCARBAMOL 500 MG PO TABS: 500.0000 mg | ORAL_TABLET | Freq: Four times a day (QID) | ORAL | Status: DC | PRN

## 2012-04-22 NOTE — Progress Notes (Signed)
Discharge Note: Pt is alert and oriented, VS are stable, denies CP.  IV discontinued.  Discharge instructions reviewed with patient, pt verbalizes understanding.  Wheelchair transportation provided, all belongings with patient  

## 2012-04-22 NOTE — Discharge Summary (Signed)
Physician Discharge Summary  Patient ID: Dennis Griffin MRN: [id number] DOB/AGE: January 22, 1931 78 y.o.  Admit date: 04/19/2012 Discharge date: 04/22/2012  Admission Diagnoses: Adjacent level stenosis    Discharge Diagnoses: Same   Discharged Condition: good  Hospital Course: The patient was admitted on 04/19/2012 and taken to the operating room where the patient underwent lumbar decompression and fusion. The patient tolerated the procedure well and was taken to the recovery room and then to the floor in stable condition. The hospital course was routine. There were no complications. The wound remained clean dry and intact. Pt had appropriate back soreness. No complaints of leg pain or new N/T/W. The patient remained afebrile with stable vital signs, and tolerated a regular diet. The patient continued to increase activities, and pain was well controlled with oral pain medications.   Consults: None  Significant Diagnostic Studies:  Results for orders placed during the hospital encounter of 04/10/12  SURGICAL PCR SCREEN      Component Value Range   MRSA, PCR NEGATIVE  NEGATIVE   Staphylococcus aureus NEGATIVE  NEGATIVE  TYPE AND SCREEN      Component Value Range   ABO/RH(D) O POS     Antibody Screen NEG     Sample Expiration 04/24/2012    BASIC METABOLIC PANEL      Component Value Range   Sodium 140  135 - 145 mEq/L   Potassium 4.3  3.5 - 5.1 mEq/L   Chloride 104  96 - 112 mEq/L   CO2 27  19 - 32 mEq/L   Glucose, Bld 94  70 - 99 mg/dL   BUN 17  6 - 23 mg/dL   Creatinine, Ser 1.47 (*) 0.50 - 1.35 mg/dL   Calcium 9.7  8.4 - 82.9 mg/dL   GFR calc non Af Amer 48 (*) >90 mL/min   GFR calc Af Amer 55 (*) >90 mL/min  CBC WITH DIFFERENTIAL      Component Value Range   WBC 4.6  4.0 - 10.5 K/uL   RBC 3.94 (*) 4.22 - 5.81 MIL/uL   Hemoglobin 11.5 (*) 13.0 - 17.0 g/dL   HCT 56.2 (*) 13.0 - 86.5 %   MCV 87.6  78.0 - 100.0 fL   MCH 29.2  26.0 - 34.0 pg   MCHC 33.3  30.0 - 36.0 g/dL   RDW 78.4  69.6 - 29.5 %   Platelets 123 (*) 150 - 400 K/uL   Neutrophils Relative 52  43 - 77 %   Neutro Abs 2.4  1.7 - 7.7 K/uL   Lymphocytes Relative 33  12 - 46 %   Lymphs Abs 1.5  0.7 - 4.0 K/uL   Monocytes Relative 8  3 - 12 %   Monocytes Absolute 0.4  0.1 - 1.0 K/uL   Eosinophils Relative 7 (*) 0 - 5 %   Eosinophils Absolute 0.3  0.0 - 0.7 K/uL   Basophils Relative 1  0 - 1 %   Basophils Absolute 0.0  0.0 - 0.1 K/uL  PROTIME-INR      Component Value Range   Prothrombin Time 13.3  11.6 - 15.2 seconds   INR 1.02  0.00 - 1.49    Chest 2 View  04/10/2012  *RADIOLOGY REPORT*  Clinical Data: Preop for back surgery.  CHEST - 2 VIEW  Comparison: 05/08/2009  Findings: Mild hyperinflation. Lateral view degraded by patient arm position.  Moderate mid thoracic spondylosis.  Right shoulder arthroplasty. Midline trachea.  Borderline cardiomegaly.  Mediastinal contours otherwise within normal limits.  No pleural effusion or pneumothorax.  Clear lungs.  IMPRESSION: No acute cardiopulmonary disease.   Original Report Authenticated By: Consuello Bossier, M.D.    Dg Lumbar Spine 2-3 Views  04/19/2012  *RADIOLOGY REPORT*  Clinical Data: L3-4 fusion  DG C-ARM 1-60 MIN,LUMBAR SPINE - 2-3 VIEW  Technique: C-arm fluoroscopy was utilized  Comparison:  CT myelogram 03/14/2012  Findings: X stop devices on the spinous processes at L4-5 and L5-S1 are unchanged from the prior study.  Trans pedicle screw fusion L3-4 has been performed since the prior study.  Hardware appears to be in satisfactory alignment.  There is disc degeneration at L4-5 and L5-S1.  IMPRESSION: Trans pedicle screw fusion L3-4 bilaterally.   Original Report Authenticated By: Camelia Phenes, M.D.    Dg C-arm 1-60 Min  04/19/2012  *RADIOLOGY REPORT*  Clinical Data: L3-4 fusion  DG C-ARM 1-60 MIN,LUMBAR SPINE - 2-3 VIEW  Technique: C-arm fluoroscopy was utilized  Comparison:  CT myelogram 03/14/2012  Findings: X stop devices on the spinous  processes at L4-5 and L5-S1 are unchanged from the prior study.  Trans pedicle screw fusion L3-4 has been performed since the prior study.  Hardware appears to be in satisfactory alignment.  There is disc degeneration at L4-5 and L5-S1.  IMPRESSION: Trans pedicle screw fusion L3-4 bilaterally.   Original Report Authenticated By: Camelia Phenes, M.D.     Antibiotics:  Anti-infectives     Start     Dose/Rate Route Frequency Ordered Stop   04/19/12 1800   ceFAZolin (ANCEF) IVPB 1 g/50 mL premix        1 g 100 mL/hr over 30 Minutes Intravenous Every 8 hours 04/19/12 1505 04/20/12 0235   04/19/12 0811   bacitracin 50,000 Units in sodium chloride irrigation 0.9 % 500 mL irrigation  Status:  Discontinued          As needed 04/19/12 0912 04/19/12 1132   04/19/12 0800   bacitracin [address] UNITS injection     Comments: RATCLIFF, ESTHER: cabinet override         04/19/12 0800 04/19/12 2014   04/19/12 0000   vancomycin (VANCOCIN) IVPB 1000 mg/200 mL premix        1,000 mg 200 mL/hr over 60 Minutes Intravenous 60 min pre-op 04/18/12 1423 04/19/12 0849          Discharge Exam: Blood pressure 126/60, pulse 53, temperature 97.7 F (36.5 C), temperature source Oral, resp. rate 18, height 5\' 9"  (1.753 m), weight 90 kg (198 lb 6.6 oz), SpO2 97.00%. Neurologic: Grossly normal Incision clean dry and intact  Discharge Medications:     Medication List     As of 04/22/2012  9:33 AM    STOP taking these medications         HYDROcodone-acetaminophen 5-325 MG per tablet   Commonly known as: NORCO/VICODIN      TAKE these medications         doxazosin 8 MG tablet   Commonly known as: CARDURA   Take 4 mg by mouth at bedtime.      losartan-hydrochlorothiazide 100-12.5 MG per tablet   Commonly known as: HYZAAR   Take 0.5 tablets by mouth daily.      methocarbamol 500 MG tablet   Commonly known as: ROBAXIN   Take 1 tablet (500 mg total) by mouth every 6 (six) hours as needed.      OVER THE  COUNTER MEDICATION   Take 1  capsule by mouth daily. Mega Red      oxyCODONE-acetaminophen 5-325 MG per tablet   Commonly known as: PERCOCET/ROXICET   Take 1-2 tablets by mouth every 6 (six) hours as needed.      simvastatin 40 MG tablet   Commonly known as: ZOCOR   Take 20 mg by mouth every evening.        Disposition: Home  Final Dx: Lumbar decompression and fusion     Discharge Orders    Future Orders Please Complete By Expires   Diet - low sodium heart healthy      Increase activity slowly      Discharge instructions      Comments:   No bending twisting or lifting   Driving Restrictions      Comments:   2 weeks   Lifting restrictions      Comments:   Less than 10 pound   Call MD for:  temperature >100.4      Call MD for:  persistant nausea and vomiting      Call MD for:  severe uncontrolled pain      Call MD for:  redness, tenderness, or signs of infection (pain, swelling, redness, odor or green/yellow discharge around incision site)      Call MD for:  difficulty breathing, headache or visual disturbances      Call MD for:  hives         Follow-up Information    Follow up with Tanna Loeffler S, MD. Schedule an appointment as soon as possible for a visit in 2 weeks.   Contact information:   [address] [place] [address] [phone]           Signed: Tia Alert 04/22/2012, 9:33 AM

## 2013-04-02 IMAGING — CR DG CHEST 2V
2 series · 2 of 2 positions shown · non-contrast
Comparison: 05/08/2009

CLINICAL DATA: Preop for back surgery.

CHEST - 2 VIEW

[view not recorded (1 of 2)]
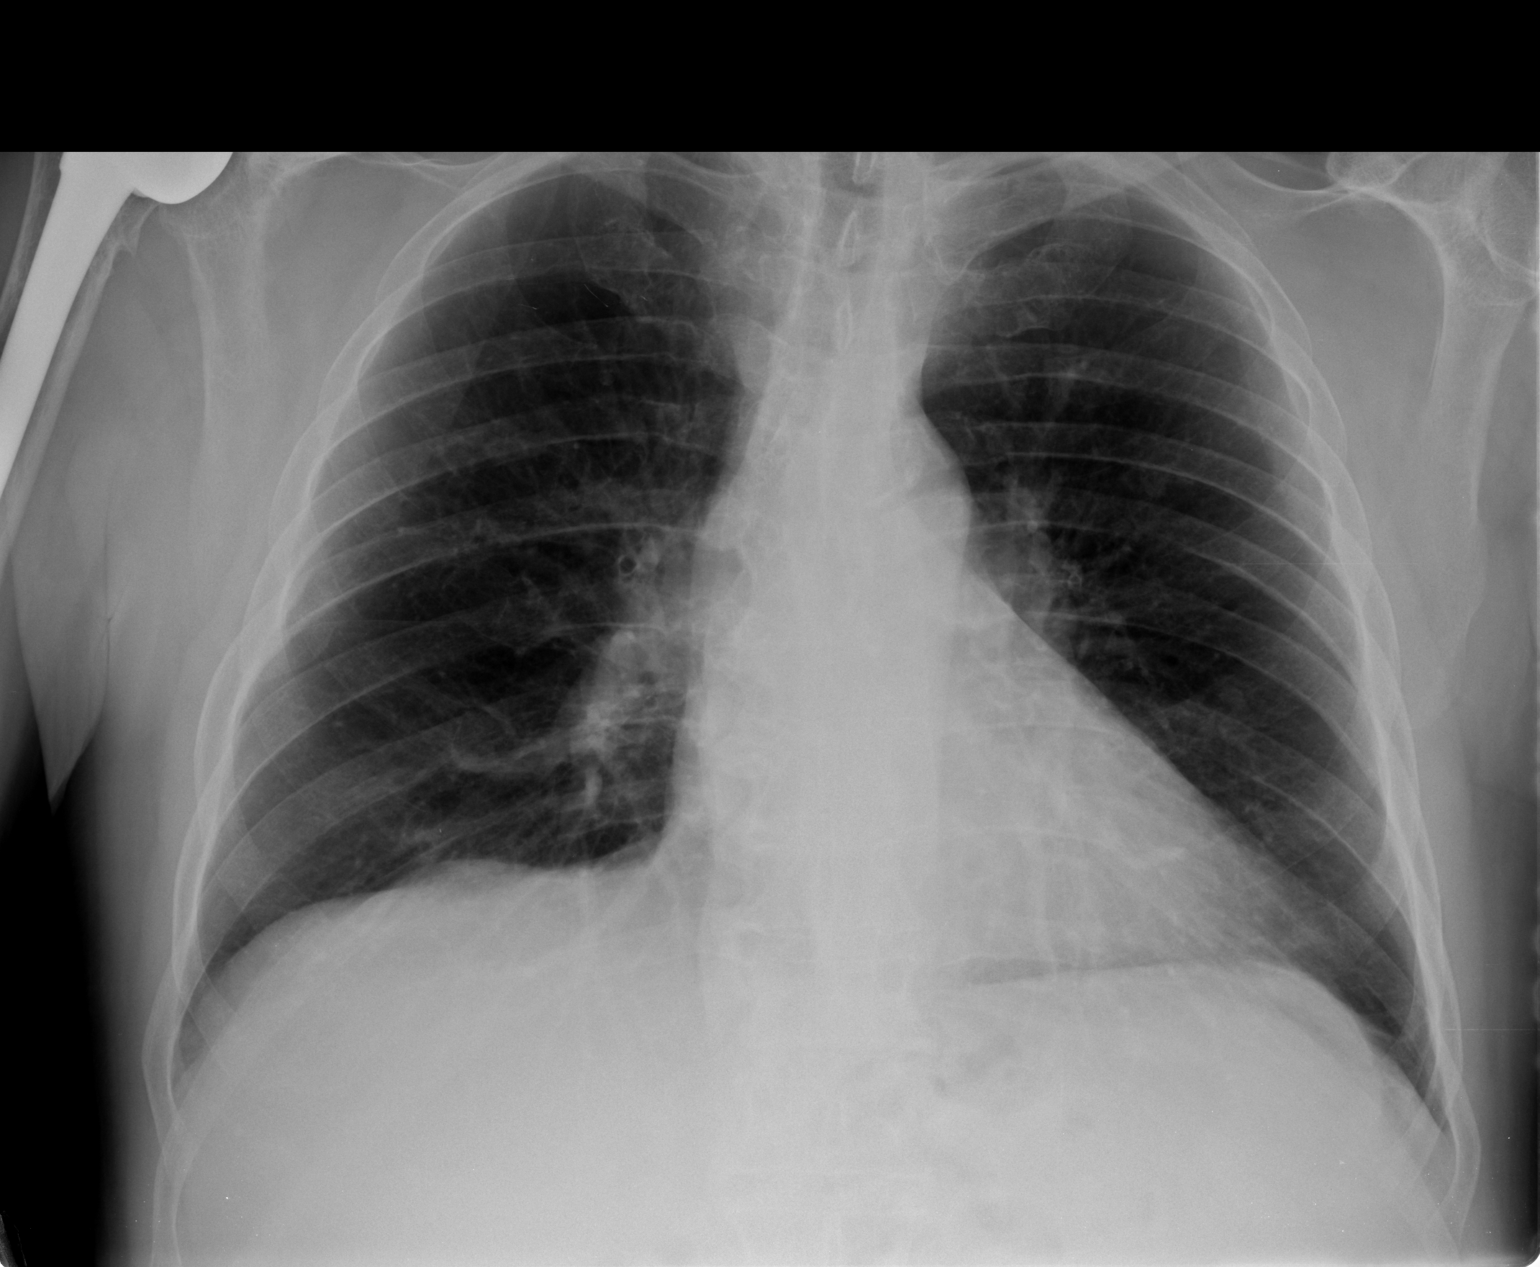

[view not recorded (2 of 2)]
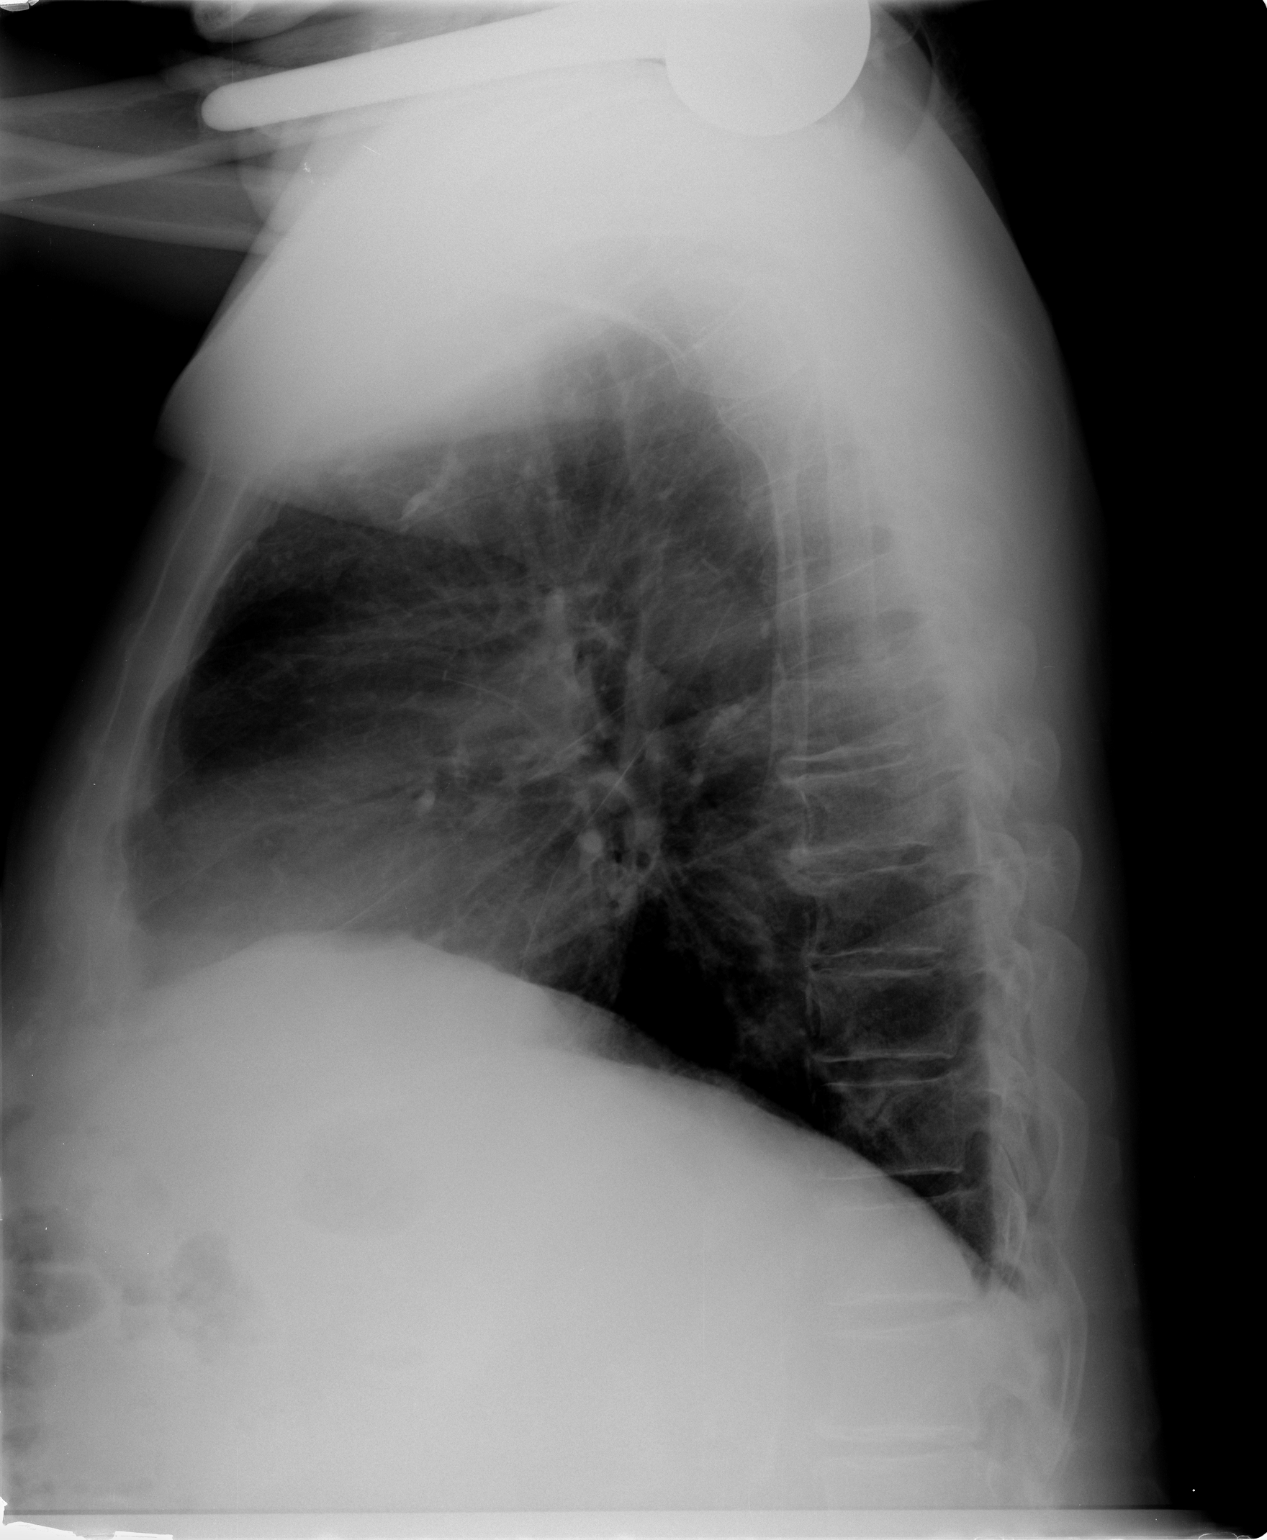

[2 of 2 positions shown; findings below may reference images not displayed]

FINDINGS: Mild hyperinflation. Lateral view degraded by patient arm
position.

Moderate mid thoracic spondylosis.  Right shoulder arthroplasty.
Midline trachea.  Borderline cardiomegaly.     Mediastinal contours
otherwise within normal limits.  No pleural effusion or
pneumothorax.  Clear lungs.
IMPRESSION: No acute cardiopulmonary disease.

## 2014-12-19 ENCOUNTER — Other Ambulatory Visit: Payer: Self-pay | Admitting: Physical Medicine and Rehabilitation

## 2014-12-19 DIAGNOSIS — M545 Low back pain: Secondary | ICD-10-CM

## 2014-12-28 ENCOUNTER — Ambulatory Visit
Admission: RE | Admit: 2014-12-28 | Discharge: 2014-12-28 | Disposition: A | Discharge status: Home / Self Care | Payer: Self-pay | Source: Ambulatory Visit | Attending: Physical Medicine and Rehabilitation | Admitting: Physical Medicine and Rehabilitation

## 2014-12-28 DIAGNOSIS — M545 Low back pain: Secondary | ICD-10-CM

## 2015-02-15 ENCOUNTER — Encounter (HOSPITAL_COMMUNITY): Payer: Self-pay | Admitting: *Deleted

## 2015-02-15 ENCOUNTER — Emergency Department (HOSPITAL_COMMUNITY)
Admission: EM | Admit: 2015-02-15 | Discharge: 2015-02-15 | Disposition: A | Discharge status: Home / Self Care | Payer: Medicare Other | Attending: Emergency Medicine | Admitting: Emergency Medicine

## 2015-02-15 ENCOUNTER — Emergency Department (HOSPITAL_COMMUNITY): Payer: Medicare Other

## 2015-02-15 DIAGNOSIS — M79672 Pain in left foot: Secondary | ICD-10-CM | POA: Diagnosis not present

## 2015-02-15 DIAGNOSIS — M25572 Pain in left ankle and joints of left foot: Secondary | ICD-10-CM | POA: Insufficient documentation

## 2015-02-15 DIAGNOSIS — R52 Pain, unspecified: Secondary | ICD-10-CM

## 2015-02-15 DIAGNOSIS — I10 Essential (primary) hypertension: Secondary | ICD-10-CM | POA: Insufficient documentation

## 2015-02-15 DIAGNOSIS — Z79899 Other long term (current) drug therapy: Secondary | ICD-10-CM | POA: Diagnosis not present

## 2015-02-15 DIAGNOSIS — Z87448 Personal history of other diseases of urinary system: Secondary | ICD-10-CM | POA: Insufficient documentation

## 2015-02-15 DIAGNOSIS — R200 Anesthesia of skin: Secondary | ICD-10-CM | POA: Diagnosis present

## 2015-02-15 DIAGNOSIS — Z880 Allergy status to penicillin: Secondary | ICD-10-CM | POA: Diagnosis not present

## 2015-02-15 DIAGNOSIS — Z87891 Personal history of nicotine dependence: Secondary | ICD-10-CM | POA: Diagnosis not present

## 2015-02-15 DIAGNOSIS — R202 Paresthesia of skin: Secondary | ICD-10-CM | POA: Diagnosis not present

## 2015-02-15 DIAGNOSIS — Z87438 Personal history of other diseases of male genital organs: Secondary | ICD-10-CM | POA: Insufficient documentation

## 2015-02-15 DIAGNOSIS — E785 Hyperlipidemia, unspecified: Secondary | ICD-10-CM | POA: Insufficient documentation

## 2015-02-15 NOTE — ED Provider Notes (Signed)
CSN: [id number]     Arrival date & time 02/15/15  7035 History   First MD Initiated Contact with Patient 02/15/15 [id number]     Chief Complaint  Patient presents with  . Ankle Pain  . Numbness     (Consider location/radiation/quality/duration/timing/severity/associated sxs/prior Treatment) HPI   ED 81-year-old male who comes in today complaining of numbness and pain to the left foot. He states there was some radiation from the back into the hip and down the leg. He states he was unable to walk after that time due to pain in the foot. He reports no trauma. He took his usual pain medication of Neurontin, and pain has resolved. He does not note any weakness, loss of bowel or bladder control, or lower extremity swelling. He denies any history of previous gout or similar symptoms in his foot, but states that he has had multiple episodes of back problems and pain. He states the pain in the back radiating to the hip has been present but the pain in the leg and foot have not previously been present.  Past Medical History  Diagnosis Date  . Hypertension   . DDD (degenerative disc disease), lumbar   . Spinal stenosis   . BPH (benign prostatic hypertrophy)   . BNC (bladder neck contracture)   . Frequency of urination   . Urgency of urination   . Nocturia   . PONV (postoperative nausea and vomiting) 1973    after ORIF rt arm  . Hyperlipemia    Past Surgical History  Procedure Laterality Date  . Lumbar fusion  05-08-2009    L5 - S1  . Total knee arthroplasty  09-04-2001    LEFT  . Tonsillectomy  1942  . Orif arm fx  1973  . Hemiarthroplasty shoulder fracture  1996    RIGHT--  TRAMATIC INJURY/ AVASCULAR NECROSIS  . Knee arthroscopy  2002    LEFT  . Transurethral resection of prostate  26 YRS AGO    AND REMOVAL OF PROSTATIC STONE  . Cataract extraction w/ intraocular lens  implant, bilateral    . Transurethral resection of prostate  09/20/2011    Procedure: TRANSURETHRAL RESECTION OF THE  PROSTATE WITH GYRUS INSTRUMENTS;  Surgeon: Molli Hazard, MD;  Location[facility];  Service: Urology;  Laterality: N/A;  . Joint replacement    . Back surgery    . Eye surgery     History reviewed. No pertinent family history. Social History  Substance Use Topics  . Smoking status: Former Smoker -- 15 years    Types: Cigarettes    Quit date: 07/23/1964  . Smokeless tobacco: Never Used  . Alcohol Use: No    Review of Systems  All other systems reviewed and are negative.     Allergies  Penicillins  Home Medications   Prior to Admission medications   Medication Sig Start Date End Date Taking? Authorizing Provider  Cholecalciferol (VITAMIN D PO) Take 1 tablet by mouth daily.   Yes Historical Provider, MD  doxazosin (CARDURA) 8 MG tablet Take 4 mg by mouth daily.  01/14/11  Yes Historical Provider, MD  gabapentin (NEURONTIN) 600 MG tablet Take 600 mg by mouth 4 (four) times daily. 12/31/14  Yes Historical Provider, MD  losartan-hydrochlorothiazide (HYZAAR) 100-12.5 MG per tablet Take 0.5 tablets by mouth daily.  02/08/11  Yes Historical Provider, MD  OVER THE COUNTER MEDICATION Take 1 capsule by mouth daily. Mega Red   Yes Historical Provider, MD  simvastatin (ZOCOR)  40 MG tablet Take 20 mg by mouth every evening.   Yes Historical Provider, MD  methocarbamol (ROBAXIN) 500 MG tablet Take 1 tablet (500 mg total) by mouth every 6 (six) hours as needed. Patient not taking: Reported on 02/15/2015 04/22/12   Eustace Moore, MD  oxyCODONE-acetaminophen (PERCOCET/ROXICET) 5-325 MG per tablet Take 1-2 tablets by mouth every 6 (six) hours as needed. Patient not taking: Reported on 02/15/2015 04/22/12   Eustace Moore, MD   BP 162/57 mmHg  Pulse 65  Temp(Src) 97.8 F (36.6 C) (Oral)  Resp 18  Ht 5\' 8"  (1.727 m)  Wt 175 lb (79.379 kg)  BMI 26.61 kg/m2  SpO2 95% Physical Exam  Constitutional: He is oriented to person, place, and time. He appears well-developed and  well-nourished.  HENT:  Head: Normocephalic and atraumatic.  Right Ear: Tympanic membrane and external ear normal.  Left Ear: Tympanic membrane and external ear normal.  Nose: Nose normal. Right sinus exhibits no maxillary sinus tenderness and no frontal sinus tenderness. Left sinus exhibits no maxillary sinus tenderness and no frontal sinus tenderness.  Eyes: Conjunctivae and EOM are normal. Pupils are equal, round, and reactive to light. Right eye exhibits no nystagmus. Left eye exhibits no nystagmus.  Neck: Normal range of motion. Neck supple.  Cardiovascular: Normal rate, regular rhythm, normal heart sounds and intact distal pulses.   Pulmonary/Chest: Effort normal and breath sounds normal. No respiratory distress. He exhibits no tenderness.  Abdominal: Soft. Bowel sounds are normal. He exhibits no distension and no mass. There is no tenderness.  Musculoskeletal: Normal range of motion. He exhibits no edema or tenderness.       Feet:  Dorsal pedalis pulses and posterior tibialis pulses are intact at 1+ bilaterally equal.  Neurological: He is alert and oriented to person, place, and time. He has normal strength and normal reflexes. No sensory deficit. He displays a negative Romberg sign. GCS eye subscore is 4. GCS verbal subscore is 5. GCS motor subscore is 6.  Reflex Scores:      Tricep reflexes are 2+ on the right side and 2+ on the left side.      Bicep reflexes are 2+ on the right side and 2+ on the left side.      Brachioradialis reflexes are 2+ on the right side and 2+ on the left side.      Patellar reflexes are 2+ on the right side and 2+ on the left side.      Achilles reflexes are 2+ on the right side and 2+ on the left side. Patient with normal gait without ataxia, shuffling, spasm, or antalgia. Speech is normal without dysarthria, dysphasia, or aphasia. Muscle strength is 5/5 in bilateral shoulders, elbow flexor and extensors, wrist flexor and extensors, and intrinsic hand  muscles. 5/5 bilateral lower extremity hip flexors, extensors, knee flexors and extensors, and ankle dorsi and plantar flexors.    Skin: Skin is warm and dry. No rash noted.  Psychiatric: He has a normal mood and affect. His behavior is normal. Judgment and thought content normal.  Nursing note and vitals reviewed.   ED Course  Procedures (including critical care time) Labs Review Labs Reviewed - No data to display  Imaging Review Dg Lumbar Spine Complete  02/15/2015   CLINICAL DATA:  Pt reports onset last night of numbness sensation that went down left leg and into foot, now reporting pain to left ankle. Pt states he has had no prior injuries to the ankle. Hx  of lumbar degenerative disc disease, and spinal stenosis. Pt had a lumbar fusion (L5-S1) in 2010 and a PLIF (L2) in 2013.  EXAM: LUMBAR SPINE - COMPLETE 4+ VIEW  COMPARISON:  MR L-spine 12/28/2014  FINDINGS: The vertebral body heights are maintained. There is relative straightening of the lumbar spine. There is posterior lumbar fusion at L3-4. There are X stop devices at L4-5 and L5-S1.  There is degenerative disc disease at L1-2, L2-3 and L5-S1. With disc space narrowing.  There is abdominal aortic atherosclerosis.  IMPRESSION: 1. No acute osseous injury of the lumbar spine. 2. Lumbar spine spondylosis as described above.   Electronically Signed   By: Kathreen Devoid   On: 02/15/2015 10:01   Dg Ankle Complete Left  02/15/2015   CLINICAL DATA:  Acute left ankle pain without known injury. Initial encounter.  EXAM: LEFT ANKLE COMPLETE - 3+ VIEW  COMPARISON:  None.  FINDINGS: There is no evidence of fracture, dislocation, or joint effusion. There is no evidence of arthropathy or other focal bone abnormality. Soft tissues are unremarkable.  IMPRESSION: Normal left ankle.   Electronically Signed   By: Marijo Conception, M.D.   On: 02/15/2015 09:58   I, Kevis Qu S, personally reviewed and evaluated these images and lab results as part of my medical  decision-making.   EKG Interpretation None      MDM   Final diagnoses:  Pain  Left foot pain  Paresthesia of left lower extremity    81 year old male who comes in today complaining of left foot pain and paresthesias. Neurologic exam reveals no weakness, change in sensation, or ability to walk. He does have some tenderness to palpation of the dorsum of the left foot and x-rays were obtained. Radiology studies of both his back and his left ankle reveal no evidence of acute bony abnormality. Patient is given return precautions specifically of change in strength or weakness of his left lower extremity, change inability to void or stool. He does have a history of spinal stenosis and is encouraged to follow-up with his back doctor.  Pattricia Boss, MD 02/15/15 1021

## 2015-02-15 NOTE — Discharge Instructions (Signed)
Please recheck with your doctor this week.  Return if any increased weakness noted or worsening pain.

## 2015-02-15 NOTE — ED Notes (Addendum)
Pt reports onset last night of numbness sensation that went down left leg and into foot, now reporting pain to left ankle. Ambulatory on arrival.

## 2015-07-28 ENCOUNTER — Other Ambulatory Visit: Payer: Self-pay | Admitting: Orthopaedic Surgery

## 2015-07-28 DIAGNOSIS — M961 Postlaminectomy syndrome, not elsewhere classified: Secondary | ICD-10-CM

## 2015-07-28 DIAGNOSIS — M960 Pseudarthrosis after fusion or arthrodesis: Secondary | ICD-10-CM

## 2015-08-04 ENCOUNTER — Ambulatory Visit
Admission: RE | Admit: 2015-08-04 | Discharge: 2015-08-04 | Disposition: A | Discharge status: Home / Self Care | Payer: Medicare Other | Source: Ambulatory Visit | Attending: Orthopaedic Surgery | Admitting: Orthopaedic Surgery

## 2015-08-04 DIAGNOSIS — M961 Postlaminectomy syndrome, not elsewhere classified: Secondary | ICD-10-CM

## 2015-08-04 DIAGNOSIS — M960 Pseudarthrosis after fusion or arthrodesis: Secondary | ICD-10-CM

## 2015-08-04 MED ORDER — IOHEXOL 180 MG/ML  SOLN
15.0000 mL | Freq: Once | INTRAMUSCULAR | Status: AC | PRN
  Administered 2015-08-04: 15 mL via INTRATHECAL

## 2015-08-04 NOTE — Discharge Instructions (Signed)

## 2015-10-03 DIAGNOSIS — N319 Neuromuscular dysfunction of bladder, unspecified: Secondary | ICD-10-CM | POA: Insufficient documentation

## 2017-01-14 ENCOUNTER — Other Ambulatory Visit: Payer: Self-pay | Admitting: Orthopedic Surgery

## 2017-01-14 DIAGNOSIS — M1711 Unilateral primary osteoarthritis, right knee: Secondary | ICD-10-CM

## 2017-01-17 ENCOUNTER — Ambulatory Visit
Admission: RE | Admit: 2017-01-17 | Discharge: 2017-01-17 | Disposition: A | Discharge status: Home / Self Care | Payer: Medicare Other | Source: Ambulatory Visit | Attending: Orthopedic Surgery | Admitting: Orthopedic Surgery

## 2017-01-17 DIAGNOSIS — M1711 Unilateral primary osteoarthritis, right knee: Secondary | ICD-10-CM

## 2017-03-10 ENCOUNTER — Ambulatory Visit: Payer: Self-pay | Admitting: Orthopedic Surgery

## 2017-03-13 ENCOUNTER — Ambulatory Visit: Payer: Self-pay | Admitting: Orthopedic Surgery

## 2017-03-13 NOTE — H&P (Signed)
Dennis Griffin DOB: 10/23/1930 Married / Language: English / Race: White Male Date of Admission:  03/30/2017 CC: right knee pain History of Present Illness The patient is a 83 year old male who comes in for a preoperative History and Physical. The patient is scheduled for a right unicompartmental knee arthroplasty to be performed by Dr. Dione Plover. Aluisio, MD at [facility] on 03/30/2017. The patient is a 83 year old male who presented for follow up of their knee. The patient is being followed for their right knee pain and osteoarthritis. They are now months out from s/p monovisc. Symptoms reported include: pain. The patient feels that they are doing poorly and report their pain level to be severe (with first steps). The following medication has been used for pain control: none. Note for "Follow-up Knee": He states that unfortunately Monovisc was not very successful. His RIGHT knee is an progressively worse. All the pain is on the medial side. He is not getting swelling. The knee does not give out on him. It is hurting with all activities now hurting at rest also appears limiting what he can and cannot do. His attestation hour feels like he needs more permanent solution to his problem. He is not having any associated hip or lower back pain with this. He would like to go ahead and get the right knee fixed at this time. They have been treated conservatively in the past for the above stated problem and despite conservative measures, they continue to have progressive pain and severe functional limitations and dysfunction. They have failed non-operative management including home exercise, medications, and injections. It is felt that they would benefit from undergoing unicompartmental knee replacement. Risks and benefits of the procedure have been discussed with the patient and they elect to proceed with surgery. There are no active contraindications to surgery such as ongoing infection or rapidly  progressive neurological disease.   Problem List/Past Medical  Primary osteoarthritis of right knee (M17.11)  High blood pressure  Gout   Allergies  PenicillAMINE *Miscellaneous Therapeutic Classes  Rash.  Family History Father  Deceased. Mother  Deceased.  Social History Children  2 Current work status  retired Furniture conservator/restorer daily; does running / walking Living situation  live with spouse Marital status  married Never consumed alcohol  10/13/2016: Never consumed alcohol No history of drug/alcohol rehab  Not under pain contract  Number of flights of stairs before winded  4-5 Tobacco / smoke exposure  10/13/2016: no Tobacco use  Never smoker. 10/13/2016 Post-Surgical Plans  Home With Caregiver.  Medication History  Zocor (Oral) Specific strength unknown - Active. Cardura (Oral) Specific strength unknown - Active. Allopurinol (300MG  Tablet, Oral) Active. Vitamin D2 Active.  Past Surgical History  Spinal Surgery  Three times ORIF Right Wrist  Total Knee Replacement - Left  Date: 2003. Dr. Ronnie Derby   Review of Systems  General Not Present- Chills, Fatigue, Fever, Memory Loss, Night Sweats, Weight Gain and Weight Loss. Skin Not Present- Eczema, Hives, Itching, Lesions and Rash. HEENT Not Present- Dentures, Double Vision, Headache, Hearing Loss, Tinnitus and Visual Loss. Respiratory Not Present- Allergies, Chronic Cough, Coughing up blood, Shortness of breath at rest and Shortness of breath with exertion. Cardiovascular Not Present- Chest Pain, Difficulty Breathing Lying Down, Murmur, Palpitations, Racing/skipping heartbeats and Swelling. Gastrointestinal Not Present- Abdominal Pain, Bloody Stool, Constipation, Diarrhea, Difficulty Swallowing, Heartburn, Jaundice, Loss of appetitie, Nausea and Vomiting. Male Genitourinary Not Present- Blood in Urine, Discharge, Flank Pain, Incontinence, Painful Urination, Urgency,  Urinary frequency, Urinary  Retention, Urinating at Night and Weak urinary stream. Musculoskeletal Present- Joint Pain. Not Present- Back Pain, Joint Swelling, Morning Stiffness, Muscle Pain, Muscle Weakness and Spasms. Neurological Not Present- Blackout spells, Difficulty with balance, Dizziness, Paralysis, Tremor and Weakness. Psychiatric Not Present- Insomnia.  Vitals  Weight: 170 lb Height: 67in Weight was reported by patient. Height was reported by patient. Body Surface Area: 1.89 m Body Mass Index: 26.63 kg/m  Pulse: 68 (Regular)  BP: 128/66 (Sitting, Right Arm, Standard)  Physical Exam General Mental Status -Alert, cooperative and good historian. General Appearance-pleasant, Not in acute distress. Orientation-Oriented X3. Build & Nutrition-Well nourished and Well developed.  Head and Neck Head-normocephalic, atraumatic . Neck Global Assessment - supple, no bruit auscultated on the right, no bruit auscultated on the left.  Eye Pupil - Bilateral-Regular and Round. Motion - Bilateral-EOMI.  ENMT Note: upper dentures   Chest and Lung Exam Auscultation Breath sounds - clear at anterior chest wall and clear at posterior chest wall. Adventitious sounds - No Adventitious sounds.  Cardiovascular Auscultation Rhythm - Regular rate and rhythm. Heart Sounds - S1 WNL and S2 WNL. Murmurs & Other Heart Sounds - Auscultation of the heart reveals - No Murmurs.  Abdomen Palpation/Percussion Tenderness - Abdomen is non-tender to palpation. Rigidity (guarding) - Abdomen is soft. Auscultation Auscultation of the abdomen reveals - Bowel sounds normal.  Male Genitourinary Note: Not done, not pertinent to present illness   Musculoskeletal Note: Evaluation of the left hip shows flexion to 120 rotation in 30 out 40 and abduction 40 without discomfort. There is no tenderness over the greater trochanter. There is no pain on provocative testing of the hip.Examination of the right hip  shows flexion to 120 rotation in 30 abduction 40 and external rotation of 40. There is no tenderness over the greater trochanter. There is no pain on provocative testing of the hip. His RIGHT knee shows no effusion. Range of motion of the RIGHT knee is 0-125. There is no crepitus or range of motion. He is very tender along the medial joint line. Is no lateral joint line tenderness or any instability. Pulses sensation and motor intact distally. He has an antalgic gait pattern on the RIGHT.  We again reviewed his radiographs today and he has near bone-on-bone are essentially bone-on-bone in the medial compartment with minimal to no patellofemoral or lateral involvement   Assessment & Plan  Primary osteoarthritis of right knee (M17.11)  Note:Surgical Plans: Right Unicompartmental Knee Replacement  Disposition: Home, Start with [place]  PCP: Dr. Leanna Battles - pending  IV TXA  Anesthesia Issues: General affected the memory following last surgery  Patient was instructed on what medications to stop prior to surgery.  Signed electronically by Joelene Millin, III PA-C

## 2017-03-17 ENCOUNTER — Other Ambulatory Visit (HOSPITAL_COMMUNITY): Payer: Self-pay | Admitting: Emergency Medicine

## 2017-03-17 NOTE — Patient Instructions (Signed)
Dennis Griffin  03/17/2017   Your procedure is scheduled on: 03-30-17  Report to [facility] Main  Entrance   Report to admitting at 6AM   Call this number if you have problems the morning of surgery  [phone]   Remember: ONLY 1 PERSON MAY GO WITH YOU TO SHORT STAY TO GET  READY MORNING OF YOUR SURGERY.  Do not eat food or drink liquids :After Midnight.     Take these medicines the morning of surgery with A SIP OF WATER: allopurinol(zyloprim), doxazosin(cardura), simvastatin(zocor)                                You may not have any metal on your body including hair pins and              piercings  Do not wear jewelry, make-up, lotions, powders or perfumes, deodorant                  Men may shave face and neck.   Do not bring valuables to the Griffin. Dennis Griffin.  Contacts, dentures or bridgework may not be worn into surgery.  Leave suitcase in the car. After surgery it may be brought to your room.                Please read over the following fact sheets you were given: _____________________________________________________________________    Dennis Griffin - Preparing for Surgery Before surgery, you can play an important role.  Because skin is not sterile, your skin needs to be as free of germs as possible.  You can reduce the number of germs on your skin by washing with CHG (chlorahexidine gluconate) soap before surgery.  CHG is an antiseptic cleaner which kills germs and bonds with the skin to continue killing germs even after washing. Please DO NOT use if you have an allergy to CHG or antibacterial soaps.  If your skin becomes reddened/irritated stop using the CHG and inform your nurse when you arrive at Short Stay. Do not shave (including legs and underarms) for at least 48 hours prior to the first CHG shower.  You may shave your face/neck. Please follow these instructions carefully:  1.  Shower  with CHG Soap the night before surgery and the  morning of Surgery.  2.  If you choose to wash your hair, wash your hair first as usual with your  normal  shampoo.  3.  After you shampoo, rinse your hair and body thoroughly to remove the  shampoo.                           4.  Use CHG as you would any other liquid soap.  You can apply chg directly  to the skin and wash                       Gently with a scrungie or clean washcloth.  5.  Apply the CHG Soap to your body ONLY FROM THE NECK DOWN.   Do not use on face/ open  Wound or open sores. Avoid contact with eyes, ears mouth and genitals (private parts).                       Wash face,  Genitals (private parts) with your normal soap.             6.  Wash thoroughly, paying special attention to the area where your surgery  will be performed.  7.  Thoroughly rinse your body with warm water from the neck down.  8.  DO NOT shower/wash with your normal soap after using and rinsing off  the CHG Soap.                9.  Pat yourself dry with a clean towel.            10.  Wear clean pajamas.            11.  Place clean sheets on your bed the night of your first shower and do not  sleep with pets. Day of Surgery : Do not apply any lotions/deodorants the morning of surgery.  Please wear clean clothes to the [facility]/surgery center.  FAILURE TO FOLLOW THESE INSTRUCTIONS MAY RESULT IN THE CANCELLATION OF YOUR SURGERY PATIENT SIGNATURE_________________________________  NURSE SIGNATURE__________________________________  ________________________________________________________________________   Dennis Griffin  An incentive spirometer is a tool that can help keep your lungs clear and active. This tool measures how well you are filling your lungs with each breath. Taking long deep breaths may help reverse or decrease the chance of developing breathing (pulmonary) problems (especially infection) following:  A long period  of time when you are unable to move or be active. BEFORE THE PROCEDURE   If the spirometer includes an indicator to show your best effort, your nurse or respiratory therapist will set it to a desired goal.  If possible, sit up straight or lean slightly forward. Try not to slouch.  Hold the incentive spirometer in an upright position. INSTRUCTIONS FOR USE  1. Sit on the edge of your bed if possible, or sit up as far as you can in bed or on a chair. 2. Hold the incentive spirometer in an upright position. 3. Breathe out normally. 4. Place the mouthpiece in your mouth and seal your lips tightly around it. 5. Breathe in slowly and as deeply as possible, raising the piston or the ball toward the top of the column. 6. Hold your breath for 3-5 seconds or for as long as possible. Allow the piston or ball to fall to the bottom of the column. 7. Remove the mouthpiece from your mouth and breathe out normally. 8. Rest for a few seconds and repeat Steps 1 through 7 at least 10 times every 1-2 hours when you are awake. Take your time and take a few normal breaths between deep breaths. 9. The spirometer may include an indicator to show your best effort. Use the indicator as a goal to work toward during each repetition. 10. After each set of 10 deep breaths, practice coughing to be sure your lungs are clear. If you have an incision (the cut made at the time of surgery), support your incision when coughing by placing a pillow or rolled up towels firmly against it. Once you are able to get out of bed, walk around indoors and cough well. You may stop using the incentive spirometer when instructed by your caregiver.  RISKS AND COMPLICATIONS  Take your time so you do not get  dizzy or light-headed.  If you are in pain, you may need to take or ask for pain medication before doing incentive spirometry. It is harder to take a deep breath if you are having pain. AFTER USE  Rest and breathe slowly and easily.  It  can be helpful to keep track of a log of your progress. Your caregiver can provide you with a simple table to help with this. If you are using the spirometer at home, follow these instructions: Dennis Griffin IF:   You are having difficultly using the spirometer.  You have trouble using the spirometer as often as instructed.  Your pain medication is not giving enough relief while using the spirometer.  You develop fever of 100.5 F (38.1 C) or higher. SEEK IMMEDIATE MEDICAL CARE IF:   You cough up bloody sputum that had not been present before.  You develop fever of 102 F (38.9 C) or greater.  You develop worsening pain at or near the incision site. MAKE SURE YOU:   Understand these instructions.  Will watch your condition.  Will get help right away if you are not doing well or get worse. Document Released: 11/01/2006 Document Revised: 09/13/2011 Document Reviewed: 01/02/2007 ExitCare Patient Information 2014 ExitCare, [place].   ________________________________________________________________________  WHAT IS A BLOOD TRANSFUSION? Blood Transfusion Information  A transfusion is the replacement of blood or some of its parts. Blood is made up of multiple cells which provide different functions.  Red blood cells carry oxygen and are used for blood loss replacement.  White blood cells fight against infection.  Platelets control bleeding.  Plasma helps clot blood.  Other blood products are available for specialized needs, such as hemophilia or other clotting disorders. BEFORE THE TRANSFUSION  Who gives blood for transfusions?   Healthy volunteers who are fully evaluated to make sure their blood is safe. This is blood bank blood. Transfusion therapy is the safest it has ever been in the practice of medicine. Before blood is taken from a donor, a complete history is taken to make sure that person has no history of diseases nor engages in risky social behavior (examples  are intravenous drug use or sexual activity with multiple partners). The donor's travel history is screened to minimize risk of transmitting infections, such as malaria. The donated blood is tested for signs of infectious diseases, such as HIV and hepatitis. The blood is then tested to be sure it is compatible with you in order to minimize the chance of a transfusion reaction. If you or a relative donates blood, this is often done in anticipation of surgery and is not appropriate for emergency situations. It takes many days to process the donated blood. RISKS AND COMPLICATIONS Although transfusion therapy is very safe and saves many lives, the main dangers of transfusion include:   Getting an infectious disease.  Developing a transfusion reaction. This is an allergic reaction to something in the blood you were given. Every precaution is taken to prevent this. The decision to have a blood transfusion has been considered carefully by your caregiver before blood is given. Blood is not given unless the benefits outweigh the risks. AFTER THE TRANSFUSION  Right after receiving a blood transfusion, you will usually feel much better and more energetic. This is especially true if your red blood cells have gotten low (anemic). The transfusion raises the level of the red blood cells which carry oxygen, and this usually causes an energy increase.  The nurse administering the transfusion will  monitor you carefully for complications. HOME CARE INSTRUCTIONS  No special instructions are needed after a transfusion. You may find your energy is better. Speak with your caregiver about any limitations on activity for underlying diseases you may have. SEEK MEDICAL CARE IF:   Your condition is not improving after your transfusion.  You develop redness or irritation at the intravenous (IV) site. SEEK IMMEDIATE MEDICAL CARE IF:  Any of the following symptoms occur over the next 12 hours:  Shaking chills.  You have a  temperature by mouth above 102 F (38.9 C), not controlled by medicine.  Chest, back, or muscle pain.  People around you feel you are not acting correctly or are confused.  Shortness of breath or difficulty breathing.  Dizziness and fainting.  You get a rash or develop hives.  You have a decrease in urine output.  Your urine turns a dark color or changes to pink, red, or brown. Any of the following symptoms occur over the next 10 days:  You have a temperature by mouth above 102 F (38.9 C), not controlled by medicine.  Shortness of breath.  Weakness after normal activity.  The white part of the eye turns yellow (jaundice).  You have a decrease in the amount of urine or are urinating less often.  Your urine turns a dark color or changes to pink, red, or brown. Document Released: 06/18/2000 Document Revised: 09/13/2011 Document Reviewed: 02/05/2008 [facility] Patient Information 2014 [place], [place].  _______________________________________________________________________

## 2017-03-21 ENCOUNTER — Encounter (HOSPITAL_COMMUNITY): Payer: Self-pay

## 2017-03-21 ENCOUNTER — Encounter (HOSPITAL_COMMUNITY)
Admission: RE | Admit: 2017-03-21 | Discharge: 2017-03-21 | Disposition: A | Discharge status: Home / Self Care | Payer: Medicare Other | Source: Ambulatory Visit | Attending: Orthopedic Surgery | Admitting: Orthopedic Surgery

## 2017-03-21 ENCOUNTER — Encounter (INDEPENDENT_AMBULATORY_CARE_PROVIDER_SITE_OTHER): Payer: Self-pay

## 2017-03-21 DIAGNOSIS — M1711 Unilateral primary osteoarthritis, right knee: Secondary | ICD-10-CM | POA: Insufficient documentation

## 2017-03-21 DIAGNOSIS — Z01810 Encounter for preprocedural cardiovascular examination: Secondary | ICD-10-CM | POA: Diagnosis not present

## 2017-03-21 DIAGNOSIS — Z01812 Encounter for preprocedural laboratory examination: Secondary | ICD-10-CM | POA: Insufficient documentation

## 2017-03-21 DIAGNOSIS — R9431 Abnormal electrocardiogram [ECG] [EKG]: Secondary | ICD-10-CM | POA: Insufficient documentation

## 2017-03-21 LAB — SURGICAL PCR SCREEN
MRSA, PCR: NEGATIVE
STAPHYLOCOCCUS AUREUS: NEGATIVE

## 2017-03-21 LAB — CBC
HCT: 39 % (ref 39.0–52.0)
HEMOGLOBIN: 12.8 g/dL — AB (ref 13.0–17.0)
MCH: 29.3 pg (ref 26.0–34.0)
MCHC: 32.8 g/dL (ref 30.0–36.0)
MCV: 89.2 fL (ref 78.0–100.0)
Platelets: 127 10*3/uL — ABNORMAL LOW (ref 150–400)
RBC: 4.37 MIL/uL (ref 4.22–5.81)
RDW: 14 % (ref 11.5–15.5)
WBC: 5 10*3/uL (ref 4.0–10.5)

## 2017-03-21 LAB — COMPREHENSIVE METABOLIC PANEL
ALBUMIN: 4.5 g/dL (ref 3.5–5.0)
ALK PHOS: 76 U/L (ref 38–126)
ALT: 20 U/L (ref 17–63)
ANION GAP: 8 (ref 5–15)
AST: 20 U/L (ref 15–41)
BILIRUBIN TOTAL: 0.9 mg/dL (ref 0.3–1.2)
BUN: 21 mg/dL — AB (ref 6–20)
CALCIUM: 9.3 mg/dL (ref 8.9–10.3)
CO2: 27 mmol/L (ref 22–32)
Chloride: 105 mmol/L (ref 101–111)
Creatinine, Ser: 1.34 mg/dL — ABNORMAL HIGH (ref 0.61–1.24)
GFR calc Af Amer: 54 mL/min — ABNORMAL LOW (ref >60)
GFR, EST NON AFRICAN AMERICAN: 47 mL/min — AB (ref >60)
GLUCOSE: 96 mg/dL (ref 65–99)
Potassium: 4.1 mmol/L (ref 3.5–5.1)
Sodium: 140 mmol/L (ref 135–145)
TOTAL PROTEIN: 6.8 g/dL (ref 6.5–8.1)

## 2017-03-21 LAB — ABO/RH: ABO/RH(D): O POS

## 2017-03-21 LAB — PROTIME-INR
INR: 0.9
PROTHROMBIN TIME: 12 s (ref 11.4–15.2)

## 2017-03-21 LAB — APTT: APTT: 27 s (ref 24–36)

## 2017-03-21 NOTE — Progress Notes (Signed)
CBC result routed via epic to Dr Gaynelle Arabian

## 2017-03-30 ENCOUNTER — Encounter (HOSPITAL_COMMUNITY): Payer: Self-pay

## 2017-03-30 ENCOUNTER — Observation Stay (HOSPITAL_COMMUNITY)
Admission: RE | Admit: 2017-03-30 | Discharge: 2017-03-31 | Disposition: A | Discharge status: Home / Self Care | Payer: Medicare Other | Source: Ambulatory Visit | Attending: Orthopedic Surgery | Admitting: Orthopedic Surgery

## 2017-03-30 ENCOUNTER — Encounter (HOSPITAL_COMMUNITY)
Admission: RE | Disposition: A | Discharge status: Home / Self Care | Payer: Self-pay | Source: Ambulatory Visit | Attending: Orthopedic Surgery

## 2017-03-30 ENCOUNTER — Ambulatory Visit (HOSPITAL_COMMUNITY): Payer: Medicare Other | Admitting: Anesthesiology

## 2017-03-30 DIAGNOSIS — Z87891 Personal history of nicotine dependence: Secondary | ICD-10-CM | POA: Insufficient documentation

## 2017-03-30 DIAGNOSIS — N401 Enlarged prostate with lower urinary tract symptoms: Secondary | ICD-10-CM | POA: Diagnosis not present

## 2017-03-30 DIAGNOSIS — Z96652 Presence of left artificial knee joint: Secondary | ICD-10-CM | POA: Insufficient documentation

## 2017-03-30 DIAGNOSIS — R350 Frequency of micturition: Secondary | ICD-10-CM | POA: Diagnosis not present

## 2017-03-30 DIAGNOSIS — Z79899 Other long term (current) drug therapy: Secondary | ICD-10-CM | POA: Insufficient documentation

## 2017-03-30 DIAGNOSIS — N138 Other obstructive and reflux uropathy: Secondary | ICD-10-CM | POA: Insufficient documentation

## 2017-03-30 DIAGNOSIS — I10 Essential (primary) hypertension: Secondary | ICD-10-CM | POA: Diagnosis not present

## 2017-03-30 DIAGNOSIS — M109 Gout, unspecified: Secondary | ICD-10-CM | POA: Diagnosis not present

## 2017-03-30 DIAGNOSIS — Z880 Allergy status to penicillin: Secondary | ICD-10-CM | POA: Insufficient documentation

## 2017-03-30 DIAGNOSIS — M1710 Unilateral primary osteoarthritis, unspecified knee: Principal | ICD-10-CM | POA: Insufficient documentation

## 2017-03-30 DIAGNOSIS — E785 Hyperlipidemia, unspecified: Secondary | ICD-10-CM | POA: Insufficient documentation

## 2017-03-30 DIAGNOSIS — M4800 Spinal stenosis, site unspecified: Secondary | ICD-10-CM | POA: Insufficient documentation

## 2017-03-30 DIAGNOSIS — R3915 Urgency of urination: Secondary | ICD-10-CM | POA: Insufficient documentation

## 2017-03-30 DIAGNOSIS — R351 Nocturia: Secondary | ICD-10-CM | POA: Diagnosis not present

## 2017-03-30 DIAGNOSIS — N320 Bladder-neck obstruction: Secondary | ICD-10-CM | POA: Diagnosis not present

## 2017-03-30 DIAGNOSIS — M5136 Other intervertebral disc degeneration, lumbar region: Secondary | ICD-10-CM | POA: Diagnosis not present

## 2017-03-30 DIAGNOSIS — M1711 Unilateral primary osteoarthritis, right knee: Secondary | ICD-10-CM | POA: Insufficient documentation

## 2017-03-30 DIAGNOSIS — M179 Osteoarthritis of knee, unspecified: Secondary | ICD-10-CM | POA: Diagnosis present

## 2017-03-30 HISTORY — PX: PARTIAL KNEE ARTHROPLASTY: SHX2174

## 2017-03-30 LAB — TYPE AND SCREEN
ABO/RH(D): O POS
Antibody Screen: NEGATIVE

## 2017-03-30 SURGERY — ARTHROPLASTY, KNEE, UNICOMPARTMENTAL
Anesthesia: General | Site: Knee | Laterality: Right

## 2017-03-30 MED ORDER — METOCLOPRAMIDE HCL 5 MG/ML IJ SOLN: 5.0000 mg | Freq: Three times a day (TID) | INTRAMUSCULAR | Status: DC | PRN

## 2017-03-30 MED ORDER — SIMVASTATIN 20 MG PO TABS
20.0000 mg | ORAL_TABLET | Freq: Every day | ORAL | Status: DC
  Administered 2017-03-31: 20 mg via ORAL
  Filled 2017-03-30: qty 1

## 2017-03-30 MED ORDER — MIDAZOLAM HCL 2 MG/2ML IJ SOLN
INTRAMUSCULAR | Status: AC
Start: 2017-03-30 — End: 2017-03-30
  Administered 2017-03-30: 1 mg via INTRAVENOUS
  Filled 2017-03-30: qty 2

## 2017-03-30 MED ORDER — HYDROMORPHONE HCL-NACL 0.5-0.9 MG/ML-% IV SOSY
0.2500 mg | PREFILLED_SYRINGE | INTRAVENOUS | Status: DC | PRN
  Administered 2017-03-30 (×6): 0.25 mg via INTRAVENOUS

## 2017-03-30 MED ORDER — ACETAMINOPHEN 500 MG PO TABS
1000.0000 mg | ORAL_TABLET | Freq: Four times a day (QID) | ORAL | Status: AC
  Administered 2017-03-30 – 2017-03-31 (×4): 1000 mg via ORAL
  Filled 2017-03-30 (×4): qty 2

## 2017-03-30 MED ORDER — BUPIVACAINE LIPOSOME 1.3 % IJ SUSP
INTRAMUSCULAR | Status: DC | PRN
  Administered 2017-03-30: 60 mL

## 2017-03-30 MED ORDER — SODIUM CHLORIDE 0.9 % IJ SOLN
INTRAMUSCULAR | Status: AC
  Filled 2017-03-30: qty 50

## 2017-03-30 MED ORDER — ALLOPURINOL 300 MG PO TABS
300.0000 mg | ORAL_TABLET | Freq: Every day | ORAL | Status: DC
  Administered 2017-03-31: 300 mg via ORAL
  Filled 2017-03-30: qty 1

## 2017-03-30 MED ORDER — METHOCARBAMOL 1000 MG/10ML IJ SOLN
500.0000 mg | Freq: Four times a day (QID) | INTRAVENOUS | Status: DC | PRN
  Administered 2017-03-30: 500 mg via INTRAVENOUS
  Filled 2017-03-30: qty 550

## 2017-03-30 MED ORDER — MIDAZOLAM HCL 2 MG/2ML IJ SOLN
1.0000 mg | INTRAMUSCULAR | Status: DC | PRN
  Administered 2017-03-30: 1 mg via INTRAVENOUS

## 2017-03-30 MED ORDER — DEXAMETHASONE SODIUM PHOSPHATE 10 MG/ML IJ SOLN
10.0000 mg | Freq: Once | INTRAMUSCULAR | Status: AC
  Administered 2017-03-31: 09:00:00 10 mg via INTRAVENOUS
  Filled 2017-03-30: qty 1

## 2017-03-30 MED ORDER — DEXAMETHASONE SODIUM PHOSPHATE 10 MG/ML IJ SOLN
INTRAMUSCULAR | Status: AC
  Filled 2017-03-30: qty 1

## 2017-03-30 MED ORDER — MORPHINE SULFATE (PF) 4 MG/ML IV SOLN: 1.0000 mg | INTRAVENOUS | Status: DC | PRN

## 2017-03-30 MED ORDER — BISACODYL 10 MG RE SUPP: 10.0000 mg | Freq: Every day | RECTAL | Status: DC | PRN

## 2017-03-30 MED ORDER — PROPOFOL 10 MG/ML IV BOLUS
INTRAVENOUS | Status: DC | PRN
  Administered 2017-03-30: 10 mg via INTRAVENOUS
  Administered 2017-03-30: 100 mg via INTRAVENOUS
  Administered 2017-03-30 (×2): 20 mg via INTRAVENOUS

## 2017-03-30 MED ORDER — MENTHOL 3 MG MT LOZG: 1.0000 | LOZENGE | OROMUCOSAL | Status: DC | PRN

## 2017-03-30 MED ORDER — CEFAZOLIN SODIUM-DEXTROSE 2-4 GM/100ML-% IV SOLN
2.0000 g | INTRAVENOUS | Status: AC
  Administered 2017-03-30: 2 g via INTRAVENOUS

## 2017-03-30 MED ORDER — RIVAROXABAN 10 MG PO TABS
10.0000 mg | ORAL_TABLET | Freq: Every day | ORAL | Status: DC
  Administered 2017-03-31: 09:00:00 10 mg via ORAL
  Filled 2017-03-30: qty 1

## 2017-03-30 MED ORDER — SODIUM CHLORIDE 0.9 % IJ SOLN
INTRAMUSCULAR | Status: DC | PRN
  Administered 2017-03-30: 40 mL

## 2017-03-30 MED ORDER — HYDROMORPHONE HCL-NACL 0.5-0.9 MG/ML-% IV SOSY
PREFILLED_SYRINGE | INTRAVENOUS | Status: AC
  Filled 2017-03-30: qty 2

## 2017-03-30 MED ORDER — ONDANSETRON HCL 4 MG/2ML IJ SOLN
INTRAMUSCULAR | Status: DC | PRN
  Administered 2017-03-30: 4 mg via INTRAVENOUS

## 2017-03-30 MED ORDER — TRANEXAMIC ACID 1000 MG/10ML IV SOLN
1000.0000 mg | INTRAVENOUS | Status: AC
  Administered 2017-03-30: 1000 mg via INTRAVENOUS
  Filled 2017-03-30: qty 1100

## 2017-03-30 MED ORDER — CEFAZOLIN SODIUM-DEXTROSE 2-4 GM/100ML-% IV SOLN
2.0000 g | Freq: Four times a day (QID) | INTRAVENOUS | Status: AC
  Administered 2017-03-30 (×2): 2 g via INTRAVENOUS
  Filled 2017-03-30 (×2): qty 100

## 2017-03-30 MED ORDER — GLYCOPYRROLATE 0.2 MG/ML IJ SOLN
INTRAMUSCULAR | Status: DC | PRN
  Administered 2017-03-30: 0.4 mg via INTRAVENOUS

## 2017-03-30 MED ORDER — TRAMADOL HCL 50 MG PO TABS: 50.0000 mg | ORAL_TABLET | Freq: Four times a day (QID) | ORAL | Status: DC | PRN

## 2017-03-30 MED ORDER — DIPHENHYDRAMINE HCL 12.5 MG/5ML PO ELIX: 12.5000 mg | ORAL_SOLUTION | ORAL | Status: DC | PRN

## 2017-03-30 MED ORDER — FENTANYL CITRATE (PF) 100 MCG/2ML IJ SOLN
INTRAMUSCULAR | Status: AC
  Filled 2017-03-30: qty 2

## 2017-03-30 MED ORDER — ONDANSETRON HCL 4 MG PO TABS: 4.0000 mg | ORAL_TABLET | Freq: Four times a day (QID) | ORAL | Status: DC | PRN

## 2017-03-30 MED ORDER — ONDANSETRON HCL 4 MG/2ML IJ SOLN
INTRAMUSCULAR | Status: AC
  Filled 2017-03-30: qty 2

## 2017-03-30 MED ORDER — FENTANYL CITRATE (PF) 100 MCG/2ML IJ SOLN
INTRAMUSCULAR | Status: DC | PRN
  Administered 2017-03-30 (×4): 25 ug via INTRAVENOUS

## 2017-03-30 MED ORDER — FENTANYL CITRATE (PF) 100 MCG/2ML IJ SOLN
INTRAMUSCULAR | Status: AC
  Administered 2017-03-30: 50 ug via INTRAVENOUS
  Filled 2017-03-30: qty 2

## 2017-03-30 MED ORDER — POLYETHYLENE GLYCOL 3350 17 G PO PACK: 17.0000 g | PACK | Freq: Every day | ORAL | Status: DC | PRN

## 2017-03-30 MED ORDER — ACETAMINOPHEN 650 MG RE SUPP: 650.0000 mg | Freq: Four times a day (QID) | RECTAL | Status: DC | PRN

## 2017-03-30 MED ORDER — FLEET ENEMA 7-19 GM/118ML RE ENEM: 1.0000 | ENEMA | Freq: Once | RECTAL | Status: DC | PRN

## 2017-03-30 MED ORDER — PROPOFOL 10 MG/ML IV BOLUS
INTRAVENOUS | Status: AC
  Filled 2017-03-30: qty 40

## 2017-03-30 MED ORDER — ACETAMINOPHEN 10 MG/ML IV SOLN
1000.0000 mg | Freq: Once | INTRAVENOUS | Status: AC
  Administered 2017-03-30: 1000 mg via INTRAVENOUS

## 2017-03-30 MED ORDER — DOXAZOSIN MESYLATE 4 MG PO TABS
4.0000 mg | ORAL_TABLET | Freq: Every day | ORAL | Status: DC
  Filled 2017-03-30: qty 1

## 2017-03-30 MED ORDER — ACETAMINOPHEN 325 MG PO TABS: 650.0000 mg | ORAL_TABLET | Freq: Four times a day (QID) | ORAL | Status: DC | PRN

## 2017-03-30 MED ORDER — DEXAMETHASONE SODIUM PHOSPHATE 10 MG/ML IJ SOLN
10.0000 mg | Freq: Once | INTRAMUSCULAR | Status: AC
  Administered 2017-03-30: 10 mg via INTRAVENOUS

## 2017-03-30 MED ORDER — LIDOCAINE 2% (20 MG/ML) 5 ML SYRINGE
INTRAMUSCULAR | Status: AC
  Filled 2017-03-30: qty 5

## 2017-03-30 MED ORDER — HYDROMORPHONE HCL-NACL 0.5-0.9 MG/ML-% IV SOSY
PREFILLED_SYRINGE | INTRAVENOUS | Status: AC
  Filled 2017-03-30: qty 1

## 2017-03-30 MED ORDER — FENTANYL CITRATE (PF) 100 MCG/2ML IJ SOLN
50.0000 ug | INTRAMUSCULAR | Status: DC | PRN
  Administered 2017-03-30 (×2): 50 ug via INTRAVENOUS

## 2017-03-30 MED ORDER — CEFAZOLIN SODIUM-DEXTROSE 2-4 GM/100ML-% IV SOLN
INTRAVENOUS | Status: AC
  Filled 2017-03-30: qty 100

## 2017-03-30 MED ORDER — CHLORHEXIDINE GLUCONATE 4 % EX LIQD: 60.0000 mL | Freq: Once | CUTANEOUS | Status: DC

## 2017-03-30 MED ORDER — PHENOL 1.4 % MT LIQD: 1.0000 | OROMUCOSAL | Status: DC | PRN

## 2017-03-30 MED ORDER — ONDANSETRON HCL 4 MG/2ML IJ SOLN: 4.0000 mg | Freq: Four times a day (QID) | INTRAMUSCULAR | Status: DC | PRN

## 2017-03-30 MED ORDER — OXYCODONE HCL 5 MG PO TABS
5.0000 mg | ORAL_TABLET | ORAL | Status: DC | PRN
  Administered 2017-03-30: 5 mg via ORAL
  Administered 2017-03-30 – 2017-03-31 (×4): 10 mg via ORAL
  Filled 2017-03-30 (×2): qty 2
  Filled 2017-03-30: qty 1
  Filled 2017-03-30 (×2): qty 2

## 2017-03-30 MED ORDER — METOCLOPRAMIDE HCL 5 MG PO TABS: 5.0000 mg | ORAL_TABLET | Freq: Three times a day (TID) | ORAL | Status: DC | PRN

## 2017-03-30 MED ORDER — METHOCARBAMOL 500 MG PO TABS
500.0000 mg | ORAL_TABLET | Freq: Four times a day (QID) | ORAL | Status: DC | PRN
  Administered 2017-03-30: 500 mg via ORAL
  Filled 2017-03-30: qty 1

## 2017-03-30 MED ORDER — SODIUM CHLORIDE 0.9 % IV SOLN
INTRAVENOUS | Status: DC
  Administered 2017-03-30: 16:00:00 100 mL/h via INTRAVENOUS

## 2017-03-30 MED ORDER — BUPIVACAINE LIPOSOME 1.3 % IJ SUSP
20.0000 mL | Freq: Once | INTRAMUSCULAR | Status: DC
  Filled 2017-03-30: qty 20

## 2017-03-30 MED ORDER — MEPERIDINE HCL 50 MG/ML IJ SOLN: 6.2500 mg | INTRAMUSCULAR | Status: DC | PRN

## 2017-03-30 MED ORDER — LACTATED RINGERS IV SOLN
INTRAVENOUS | Status: DC
  Administered 2017-03-30: 1000 mL via INTRAVENOUS
  Administered 2017-03-30: 09:00:00 via INTRAVENOUS

## 2017-03-30 MED ORDER — GABAPENTIN 300 MG PO CAPS: 300.0000 mg | ORAL_CAPSULE | Freq: Every day | ORAL | Status: DC | PRN

## 2017-03-30 MED ORDER — ONDANSETRON HCL 4 MG/2ML IJ SOLN: 4.0000 mg | Freq: Once | INTRAMUSCULAR | Status: DC | PRN

## 2017-03-30 MED ORDER — DOCUSATE SODIUM 100 MG PO CAPS
100.0000 mg | ORAL_CAPSULE | Freq: Two times a day (BID) | ORAL | Status: DC
  Administered 2017-03-30 – 2017-03-31 (×2): 100 mg via ORAL
  Filled 2017-03-30 (×2): qty 1

## 2017-03-30 MED ORDER — ACETAMINOPHEN 10 MG/ML IV SOLN
INTRAVENOUS | Status: AC
  Filled 2017-03-30: qty 100

## 2017-03-30 SURGICAL SUPPLY — 43 items
BAG DECANTER FOR FLEXI CONT (MISCELLANEOUS) ×1 IMPLANT
BAG SPEC THK2 15X12 ZIP CLS (MISCELLANEOUS)
BAG ZIPLOCK 12X15 (MISCELLANEOUS) IMPLANT
BANDAGE ACE 6X5 VEL STRL LF (GAUZE/BANDAGES/DRESSINGS) ×3 IMPLANT
BLADE SAW RECIPROCATING 77.5 (BLADE) ×3 IMPLANT
BLADE SAW SGTL 13.0X1.19X90.0M (BLADE) ×3 IMPLANT
BOWL SMART MIX CTS (DISPOSABLE) ×3 IMPLANT
BUR OVAL CARBIDE 4.0 (BURR) ×3 IMPLANT
CAPT KNEE PARTIAL 2 ×3 IMPLANT
CEMENT HV SMART SET (Cement) ×3 IMPLANT
CLOSURE WOUND 1/2 X4 (GAUZE/BANDAGES/DRESSINGS) ×2
CLOTH BEACON ORANGE TIMEOUT ST (SAFETY) ×3 IMPLANT
COVER SURGICAL LIGHT HANDLE (MISCELLANEOUS) ×3 IMPLANT
CUFF TOURN SGL QUICK 34 (TOURNIQUET CUFF) ×3
CUFF TRNQT CYL 34X4X40X1 (TOURNIQUET CUFF) ×1 IMPLANT
DRSG ADAPTIC 3X8 NADH LF (GAUZE/BANDAGES/DRESSINGS) ×3 IMPLANT
DRSG PAD ABDOMINAL 8X10 ST (GAUZE/BANDAGES/DRESSINGS) ×3 IMPLANT
DURAPREP 26ML APPLICATOR (WOUND CARE) ×3 IMPLANT
ELECT REM PT RETURN 15FT ADLT (MISCELLANEOUS) ×3 IMPLANT
EVACUATOR 1/8 PVC DRAIN (DRAIN) ×3 IMPLANT
GAUZE SPONGE 4X4 12PLY STRL (GAUZE/BANDAGES/DRESSINGS) ×3 IMPLANT
GLOVE BIO SURGEON STRL SZ7.5 (GLOVE) ×13 IMPLANT
GLOVE BIO SURGEON STRL SZ8 (GLOVE) ×3 IMPLANT
GLOVE BIOGEL PI IND STRL 8 (GLOVE) ×2 IMPLANT
GLOVE BIOGEL PI INDICATOR 8 (GLOVE) ×4
GOWN STRL REUS W/TWL LRG LVL3 (GOWN DISPOSABLE) ×5 IMPLANT
GOWN STRL REUS W/TWL XL LVL3 (GOWN DISPOSABLE) ×3 IMPLANT
HANDPIECE INTERPULSE COAX TIP (DISPOSABLE) ×3
IMMOBILIZER KNEE 20 (SOFTGOODS) ×5 IMPLANT
IMMOBILIZER KNEE 20 THIGH 36 (SOFTGOODS) ×1 IMPLANT
KIT IMPL STRL TIB IPOLY IUNI IMPLANT
MANIFOLD NEPTUNE II (INSTRUMENTS) ×3 IMPLANT
PACK TOTAL KNEE CUSTOM (KITS) ×3 IMPLANT
PADDING CAST COTTON 6X4 STRL (CAST SUPPLIES) ×6 IMPLANT
POSITIONER SURGICAL ARM (MISCELLANEOUS) ×3 IMPLANT
SET HNDPC FAN SPRY TIP SCT (DISPOSABLE) ×1 IMPLANT
STRIP CLOSURE SKIN 1/2X4 (GAUZE/BANDAGES/DRESSINGS) ×4 IMPLANT
SUT MNCRL AB 4-0 PS2 18 (SUTURE) ×3 IMPLANT
SUT STRATAFIX 0 PDS 27 VIOLET (SUTURE) ×3
SUT VIC AB 2-0 CT1 27 (SUTURE) ×6
SUT VIC AB 2-0 CT1 TAPERPNT 27 (SUTURE) ×2 IMPLANT
SUTURE STRATFX 0 PDS 27 VIOLET (SUTURE) ×1 IMPLANT
SYR 50ML LL SCALE MARK (SYRINGE) ×3 IMPLANT

## 2017-03-30 NOTE — Progress Notes (Signed)
AssistedDr. Ossey with right, ultrasound guided, adductor canal block. Side rails up, monitors on throughout procedure. See vital signs in flow sheet. Tolerated Procedure well.  

## 2017-03-30 NOTE — Transfer of Care (Signed)
Immediate Anesthesia Transfer of Care Note  Patient: Dennis Griffin  Procedure(s) Performed: Procedure(s): Right knee medial unicompartmental arthroplasty (Right)  Patient Location: PACU  Anesthesia Type:General and Regional  Level of Consciousness: drowsy and patient cooperative  Airway & Oxygen Therapy: Patient Spontanous Breathing and Patient connected to face mask oxygen  Post-op Assessment: Report given to RN and Post -op Vital signs reviewed and stable  Post vital signs: Reviewed and stable  Last Vitals:  Vitals:   03/30/17 0756 03/30/17 0757  BP:    Pulse: 63 69  Resp: 11 13  Temp:    SpO2: 100% 99%    Last Pain:  Vitals:   03/30/17 0639  TempSrc: Oral      Patients Stated Pain Goal: 4 (50/09/38 1829)  Complications: No apparent anesthesia complications

## 2017-03-30 NOTE — Op Note (Signed)
OPERATIVE REPORT-UNICOMPARTMENTAL ARTHROPLASTY  PREOPERATIVE DIAGNOSIS: Medial compartment osteoarthritis, Right knee  POSTOPERATIVE DIAGNOSIS: Medial compartment osteoarthritis, Right knee  PROCEDURE:Right knee medial unicompartmental arthroplasty.   SURGEON: Gaynelle Arabian, MD   ASSISTANT: Arlee Muslim, PA-C  ANESTHESIA:  Adductor canal block and spinal.   ESTIMATED BLOOD LOSS: Minimal.   DRAINS: Hemovac x1.   TOURNIQUET TIME:   Total Tourniquet Time Documented: Thigh (Right) - 30 minutes Total: Thigh (Right) - 30 minutes    COMPLICATIONS: None.   CONDITION: Stable to recovery.   BRIEF CLINICAL NOTE:Dennis Griffin is a 83 y.o. male, who has  significant isolated medial compartment arthritis of the Right knee. The patient has had nonoperative management including injections of cortisone and viscous supplements. Unfortunately, the pain persists.  Radiograph showed isolated medial compartment bone-on-bone arthritis  with normal-appearing patellofemoral and lateral compartments. The patient presents now for left knee unicompartmental arthroplasty.   PROCEDURE IN DETAIL: After successful administration of  Adductor canal block and spinal anesthetic, a tourniquet was placed high on the  Right thigh and the Right lower extremity prepped and draped in usual sterile fashion. Extremity was wrapped in an Esmarch, knee flexed, and tourniquet inflated to 300 mmHg.       A midline incision was made with a 10 blade through subcutaneous  tissue to the extensor mechanism. A fresh blade was used to make a  medial parapatellar arthrotomy. Soft tissue on the proximal medial  tibia subperiosteally elevated to the joint line with a knife and into  the semimembranosus bursa with a Cobb elevator. The patella was  subluxed laterally, and the knee flexed 90 degrees. The ACL was intact.  The marginal osteophytes on the medial femur and tibia were removed with  a rongeur. The medial  meniscus was also removed. The femoral cutting  block for the conformis unicompartmental knee system was placed along  the femur. There was excellent fit. I traced the outline. We then  removed any remaining cartilage within this outline. We then placed the  cutting block again and pinned in position. The posterior femoral cut  was made, it was approximately 5 mm. The lug holes for the femoral  component were then drilled through the cutting block. The cutting  block was subsequently removed. We then utilized the high speed burr to  create a small trough at the superior aspect of the component tomake it inset and would not overhang the cartilage. The trial was placed,  it had excellent fit. The trial was subsequently removed.       The trial was placed again and the B chip was placed. There was  excellent balance throughout full motion. Also with excellent fit on  the tibia. This was removed as was the femoral trial. A curette was  used to remove any remaining cartilage from the tibia. The tibial  cutting block was then placed and there was a perfect fit on the tibial  surface. The appropriate slope was placed and it was pinned in  position. The reciprocating saw was used to make the central cut and  then the oscillating saw used to make the horizontal cut. The bone  fragment was then removed. The tibial trial was placed and had perfect  fit on the tibia. We then drilled the 2 lug holes and did the keel punch.  We then placed tibia trial and femur trial, and a 6 mm trial insert. There was  excellent stability throughout full range of motion and no impingement.  The trial  was then removed. We drilled small holes in the distal  femur in order to create more conduits for the cement. The cut bone  surfaces were thoroughly irrigated with pulsatile lavage while the  cement was mixed on the back table. We then cemented the tibial  component into place, impacted it and removed the extruded cement.  The  same was done for the femoral component. Trial 6-mm inserts placed,  knee held in full extension, and all extruded cement removed. While the  cement was hardening, I injected the extensor mechanism, periosteum of  the femur and subcu tissues, a total of 20 mL of Exparel mixed with 30  mL of saline and then did an additional injection of 20 mL of 0.25%  Marcaine into the same tissues. When the cement had fully hardened,  then the permanent polyethylene was placed in tibial tray. There was  excellent stability throughout full range of motion with no lift off the  component and no evidence of any impingement.       Wound was copiously irrigated with saline solution, and the arthrotomy closed over a Hemovac drain with a running #1 V-Loc suture. The subcutaneous was closed with  interrupted 2-0 Vicryl and subcuticular running 4-0 Monocryl. The drain  was hooked to suction. Incision cleaned and dried and Steri-Strips and  a bulky sterile dressing applied. The tourniquet was released after a  total time of 30 minutes. This was done after closing the extensor  mechanism. The wound was closed and a bulky sterile dressing was  applied. The operative limb was placed into a knee immobilizer, and the patient awakened and transported to recovery room in stable condition.       Please note that a surgical assistant was a medical necessity for this  procedure in order to perform it in a safe and expeditious manner.  Assistance was necessary for retracting vital ligaments, neurovascular  structures, as well as for proper positioning of the limb to allow for  appropriate bone cuts and appropriate placement of the prosthesis.    Dennis Plover Atley Scarboro, MD

## 2017-03-30 NOTE — Discharge Instructions (Addendum)
Dr. Gaynelle Arabian Total Joint Specialist [facility] [address], [place], [place] [address] [phone]  UNI KNEE REPLACEMENT POSTOPERATIVE DIRECTIONS   Knee Rehabilitation, Guidelines Following Surgery  Results after knee surgery are often greatly improved when you follow the exercise, range of motion and muscle strengthening exercises prescribed by your doctor. Safety measures are also important to protect the knee from further injury. Any time any of these exercises cause you to have increased pain or swelling in your knee joint, decrease the amount until you are comfortable again and slowly increase them. If you have problems or questions, call your caregiver or physical therapist for advice.   HOME CARE INSTRUCTIONS  Remove items at home which could result in a fall. This includes throw rugs or furniture in walking pathways.   ICE to the affected knee every three hours for 30 minutes at a time and then as needed for pain and swelling.  Continue to use ice on the knee for pain and swelling from surgery. You may notice swelling that will progress down to the foot and ankle.  This is normal after surgery.  Elevate the leg when you are not up walking on it.    Continue to use the breathing machine which will help keep your temperature down.  It is common for your temperature to cycle up and down following surgery, especially at night when you are not up moving around and exerting yourself.  The breathing machine keeps your lungs expanded and your temperature down.  Do not place pillow under knee, focus on keeping the knee straight while resting  DIET You may resume your previous home diet once your are discharged from the hospital.  DRESSING / WOUND CARE / SHOWERING You may shower 3 days after surgery, but keep the wounds dry during showering.  You may use an occlusive plastic wrap (Press'n Seal for example), NO SOAKING/SUBMERGING IN THE BATHTUB.  If the  bandage gets wet, change with a clean dry gauze.  If the incision gets wet, pat the wound dry with a clean towel. You may start showering once you are discharged home but do not submerge the incision under water. Just pat the incision dry and apply a dry gauze dressing on daily. Change the surgical dressing daily and reapply a dry dressing each time.  ACTIVITY Walk with your walker as instructed. Use walker as long as suggested by your caregivers. Avoid periods of inactivity such as sitting longer than an hour when not asleep. This helps prevent blood clots.  You may resume a sexual relationship in one month or when given the OK by your doctor.  You may return to work once you are cleared by your doctor.  Do not drive a car for 6 weeks or until released by you surgeon.  Do not drive while taking narcotics.  WEIGHT BEARING Weight bearing as tolerated with assist device (walker, cane, etc) as directed, use it as long as suggested by your surgeon or therapist, typically at least 4-6 weeks.  POSTOPERATIVE CONSTIPATION PROTOCOL Constipation - defined medically as fewer than three stools per week and severe constipation as less than one stool per week.  One of the most common issues patients have following surgery is constipation.  Even if you have a regular bowel pattern at home, your normal regimen is likely to be disrupted due to multiple reasons following surgery.  Combination of anesthesia, postoperative narcotics, change in appetite and fluid intake all can affect your bowels.  In order to avoid complications following surgery, here are some recommendations in order to help you during your recovery period.  Colace (docusate) - Pick up an over-the-counter form of Colace or another stool softener and take twice a day as long as you are requiring postoperative pain medications.  Take with a full glass of water daily.  If you experience loose stools or diarrhea, hold the colace until you stool forms  back up.  If your symptoms do not get better within 1 week or if they get worse, check with your doctor.  Dulcolax (bisacodyl) - Pick up over-the-counter and take as directed by the product packaging as needed to assist with the movement of your bowels.  Take with a full glass of water.  Use this product as needed if not relieved by Colace only.   MiraLax (polyethylene glycol) - Pick up over-the-counter to have on hand.  MiraLax is a solution that will increase the amount of water in your bowels to assist with bowel movements.  Take as directed and can mix with a glass of water, juice, soda, coffee, or tea.  Take if you go more than two days without a movement. Do not use MiraLax more than once per day. Call your doctor if you are still constipated or irregular after using this medication for 7 days in a row.  If you continue to have problems with postoperative constipation, please contact the office for further assistance and recommendations.  If you experience "the worst abdominal pain ever" or develop nausea or vomiting, please contact the office immediatly for further recommendations for treatment.  ITCHING  If you experience itching with your medications, try taking only a single pain pill, or even half a pain pill at a time.  You can also use Benadryl over the counter for itching or also to help with sleep.   TED HOSE STOCKINGS Wear the elastic stockings on both legs for three weeks following surgery during the day but you may remove then at night for sleeping.  MEDICATIONS See your medication summary on the After Visit Summary that the nursing staff will review with you prior to discharge.  You may have some home medications which will be placed on hold until you complete the course of blood thinner medication.  It is important for you to complete the blood thinner medication as prescribed by your surgeon.  Continue your approved medications as instructed at time of  discharge.  PRECAUTIONS If you experience chest pain or shortness of breath - call 911 immediately for transfer to the hospital emergency department.  If you develop a fever greater that 101 F, purulent drainage from wound, increased redness or drainage from wound, foul odor from the wound/dressing, or calf pain - CONTACT YOUR SURGEON.                                                   FOLLOW-UP APPOINTMENTS Make sure you keep all of your appointments after your operation with your surgeon and caregivers. You should call the office at the above phone number and make an appointment for approximately two weeks after the date of your surgery or on the date instructed by your surgeon outlined in the "After Visit Summary".  RANGE OF MOTION AND STRENGTHENING EXERCISES  Rehabilitation of the knee is important following a knee injury or an  operation. After just a few days of immobilization, the muscles of the thigh which control the knee become weakened and shrink (atrophy). Knee exercises are designed to build up the tone and strength of the thigh muscles and to improve knee motion. Often times heat used for twenty to thirty minutes before working out will loosen up your tissues and help with improving the range of motion but do not use heat for the first two weeks following surgery. These exercises can be done on a training (exercise) mat, on the floor, on a table or on a bed. Use what ever works the best and is most comfortable for you Knee exercises include:  Leg Lifts - While your knee is still immobilized in a splint or cast, you can do straight leg raises. Lift the leg to 60 degrees, hold for 3 sec, and slowly lower the leg. Repeat 10-20 times 2-3 times daily. Perform this exercise against resistance later as your knee gets better.  Quad and Hamstring Sets - Tighten up the muscle on the front of the thigh (Quad) and hold for 5-10 sec. Repeat this 10-20 times hourly. Hamstring sets are done by pushing the  foot backward against an object and holding for 5-10 sec. Repeat as with quad sets.   Leg Slides: Lying on your back, slowly slide your foot toward your buttocks, bending your knee up off the floor (only go as far as is comfortable). Then slowly slide your foot back down until your leg is flat on the floor again.  Angel Wings: Lying on your back spread your legs to the side as far apart as you can without causing discomfort.  A rehabilitation program following serious knee injuries can speed recovery and prevent re-injury in the future due to weakened muscles. Contact your doctor or a physical therapist for more information on knee rehabilitation.   IF YOU ARE TRANSFERRED TO A SKILLED REHAB FACILITY If the patient is transferred to a skilled rehab facility following release from the hospital, a list of the current medications will be sent to the facility for the patient to continue.  When discharged from the skilled rehab facility, please have the facility set up the patient's [place] prior to being released. Also, the skilled facility will be responsible for providing the patient with their medications at time of release from the facility to include their pain medication, the muscle relaxants, and their blood thinner medication. If the patient is still at the rehab facility at time of the two week follow up appointment, the skilled rehab facility will also need to assist the patient in arranging follow up appointment in our office and any transportation needs.  MAKE SURE YOU:  Understand these instructions.  Get help right away if you are not doing well or get worse.    Pick up stool softner and laxative for home use following surgery while on pain medications. Do not submerge incision under water. Please use good hand washing techniques while changing dressing each day. May shower starting three days after surgery. Please use a clean towel to pat the incision dry following  showers. Continue to use ice for pain and swelling after surgery. Do not use any lotions or creams on the incision until instructed by your surgeon.   Take Xarelto 10 mg daily for ten days, then change to Aspirin 325 mg daily for two weeks, then reduce to Baby Aspirin 81 mg daily for three additional weeks.      Information  on my medicine - XARELTO (Rivaroxaban)  Why was Xarelto prescribed for you? Xarelto was prescribed for you to reduce the risk of blood clots forming after orthopedic surgery. The medical term for these abnormal blood clots is venous thromboembolism (VTE).  What do you need to know about xarelto ? Take your Xarelto ONCE DAILY at the same time every day. You may take it either with or without food.  If you have difficulty swallowing the tablet whole, you may crush it and mix in applesauce just prior to taking your dose.  Take Xarelto exactly as prescribed by your doctor and DO NOT stop taking Xarelto without talking to the doctor who prescribed the medication.  Stopping without other VTE prevention medication to take the place of Xarelto may increase your risk of developing a clot.  After discharge, you should have regular check-up appointments with your healthcare provider that is prescribing your Xarelto.    What do you do if you miss a dose? If you miss a dose, take it as soon as you remember on the same day then continue your regularly scheduled once daily regimen the next day. Do not take two doses of Xarelto on the same day.   Important Safety Information A possible side effect of Xarelto is bleeding. You should call your healthcare provider right away if you experience any of the following: ? Bleeding from an injury or your nose that does not stop. ? Unusual colored urine (red or dark brown) or unusual colored stools (red or black). ? Unusual bruising for unknown reasons. ? A serious fall or if you hit your head (even if there is no  bleeding).  Some medicines may interact with Xarelto and might increase your risk of bleeding while on Xarelto. To help avoid this, consult your healthcare provider or pharmacist prior to using any new prescription or non-prescription medications, including herbals, vitamins, non-steroidal anti-inflammatory drugs (NSAIDs) and supplements.  This website has more information on Xarelto: [email or web address]

## 2017-03-30 NOTE — Anesthesia Postprocedure Evaluation (Signed)
Anesthesia Post Note  Patient: OTHNIEL MARET  Procedure(s) Performed: Procedure(s) (LRB): Right knee medial unicompartmental arthroplasty (Right)     Patient location during evaluation: PACU Anesthesia Type: Spinal Level of consciousness: awake and alert Pain management: pain level controlled Vital Signs Assessment: post-procedure vital signs reviewed and stable Respiratory status: spontaneous breathing, nonlabored ventilation, respiratory function stable and patient connected to nasal cannula oxygen Cardiovascular status: blood pressure returned to baseline and stable Postop Assessment: no apparent nausea or vomiting Anesthetic complications: no    Last Vitals:  Vitals:   03/30/17 1247 03/30/17 1300  BP: (!) 148/77 (!) 154/70  Pulse:  (!) 50  Resp:  11  Temp:    SpO2:  97%    Last Pain:  Vitals:   03/30/17 1200  TempSrc:   PainSc: 7                  Maxwell Lemen DAVID

## 2017-03-30 NOTE — Anesthesia Procedure Notes (Signed)
Procedure Name: LMA Insertion Date/Time: 03/30/2017 8:42 AM Performed by: Dione Booze Pre-anesthesia Checklist: Patient identified, Emergency Drugs available, Suction available and Patient being monitored Patient Re-evaluated:Patient Re-evaluated prior to induction Preoxygenation: Pre-oxygenation with 100% oxygen Induction Type: IV induction LMA: LMA with gastric port inserted LMA Size: 4.0 Number of attempts: 1 Placement Confirmation: positive ETCO2 Tube secured with: Tape Dental Injury: Teeth and Oropharynx as per pre-operative assessment

## 2017-03-30 NOTE — Anesthesia Preprocedure Evaluation (Addendum)
Anesthesia Evaluation  Patient identified by MRN, date of birth, ID band Patient awake    Reviewed: Allergy & Precautions, NPO status , Patient's Chart, lab work & pertinent test results  History of Anesthesia Complications (+) PONV  Airway Mallampati: I  TM Distance: >3 FB Neck ROM: Full    Dental   Pulmonary former smoker,    Pulmonary exam normal        Cardiovascular hypertension, Pt. on medications Normal cardiovascular exam     Neuro/Psych    GI/Hepatic   Endo/Other    Renal/GU      Musculoskeletal   Abdominal   Peds  Hematology   Anesthesia Other Findings   Reproductive/Obstetrics                             Anesthesia Physical Anesthesia Plan  ASA: II  Anesthesia Plan: Spinal   Post-op Pain Management:  Regional for Post-op pain   Induction: Intravenous  PONV Risk Score and Plan: 2 and Ondansetron and Dexamethasone  Airway Management Planned: Simple Face Mask  Additional Equipment:   Intra-op Plan:   Post-operative Plan:   Informed Consent: I have reviewed the patients History and Physical, chart, labs and discussed the procedure including the risks, benefits and alternatives for the proposed anesthesia with the patient or authorized representative who has indicated his/her understanding and acceptance.     Plan Discussed with: CRNA and Surgeon  Anesthesia Plan Comments: (Attempted SAB. Unable to pass between lamina - GA)       Anesthesia Quick Evaluation

## 2017-03-30 NOTE — Interval H&P Note (Signed)
History and Physical Interval Note:  03/30/2017 6:31 AM  Dennis Griffin  has presented today for surgery, with the diagnosis of Right knee medial compartment osteoarthritis   The various methods of treatment have been discussed with the patient and family. After consideration of risks, benefits and other options for treatment, the patient has consented to  Procedure(s): Right knee medial unicompartmental arthroplasty (Right) as a surgical intervention .  The patient's history has been reviewed, patient examined, no change in status, stable for surgery.  I have reviewed the patient's chart and labs.  Questions were answered to the patient's satisfaction.     Gearlean Alf

## 2017-03-30 NOTE — Evaluation (Signed)
Physical Therapy Evaluation Patient Details Name: Dennis Griffin MRN: [id number] DOB: 07-Jun-1931 Today's Date: 03/30/2017   History of Present Illness  R UKR  Clinical Impression  THE PATIENT AMBULATED X 60'.  PLANS DC HOME WITH HHPT. Pt admitted with above diagnosis. Pt currently with functional limitations due to the deficits listed below (see PT Problem List).  Pt will benefit from skilled PT to increase their independence and safety with mobility to allow discharge to the venue listed below.       Follow Up Recommendations Home health PT;DC plan and follow up therapy as arranged by surgeon    Equipment Recommendations  Rolling walker with 5" wheels - patient may have one,    Recommendations for Other Services       Precautions / Restrictions Precautions Precautions: Fall;Knee Required Braces or Orthoses: Knee Immobilizer - Right Restrictions Weight Bearing Restrictions: No      Mobility  Bed Mobility Overal bed mobility: Needs Assistance Bed Mobility: Supine to Sit     Supine to sit: Min assist     General bed mobility comments: cues for technique  Transfers Overall transfer level: Needs assistance Equipment used: Rolling walker (2 wheeled) Transfers: Sit to/from Stand Sit to Stand: Min assist         General transfer comment: cues for  hand and  right leg position  Ambulation/Gait Ambulation/Gait assistance: Min assist Ambulation Distance (Feet): 60 Feet Assistive device: Rolling walker (2 wheeled) Gait Pattern/deviations: Step-to pattern;Step-through pattern     General Gait Details: cues for sequence  Stairs            Wheelchair Mobility    Modified Rankin (Stroke Patients Only)       Balance                                             Pertinent Vitals/Pain Pain Assessment: 0-10 Pain Score: 7  Pain Location: r knee Pain Descriptors / Indicators: Aching;Guarding;Grimacing Pain Intervention(s): Repositioned;Ice  applied;Premedicated before session;Monitored during session;Limited activity within patient's tolerance    Home Living Family/patient expects to be discharged to:: Private residence Living Arrangements: Spouse/significant other Available Help at Discharge: Family Type of Home: House Home Access: Level entry     [place]: One [place]: Environmental consultant - 2 wheels      Prior Function Level of Independence: Independent               Hand Dominance        Extremity/Trunk Assessment   Upper Extremity Assessment Upper Extremity Assessment: Overall WFL for tasks assessed    Lower Extremity Assessment Lower Extremity Assessment: RLE deficits/detail RLE Deficits / Details: + SLR with a lag    Cervical / Trunk Assessment Cervical / Trunk Assessment: Normal  Communication   Communication: No difficulties  Cognition Arousal/Alertness: Awake/alert Behavior During Therapy: WFL for tasks assessed/performed Overall Cognitive Status: Within Functional Limits for tasks assessed                                        General Comments      Exercises     Assessment/Plan    PT Assessment Patient needs continued PT services  PT Problem List Decreased strength;Decreased range of motion;Decreased activity tolerance;Decreased mobility;Decreased knowledge of precautions;Decreased safety  awareness;Decreased knowledge of use of DME;Pain       PT Treatment Interventions DME instruction;Gait training;Functional mobility training;Therapeutic activities;Therapeutic exercise;Patient/family education    PT Goals (Current goals can be found in the Care Plan section)  Acute Rehab PT Goals Patient Stated Goal: to mow PT Goal Formulation: With patient/family Time For Goal Achievement: 04/02/17 Potential to Achieve Goals: Good    Frequency 7X/week   Barriers to discharge        Co-evaluation               AM-PAC PT "6 Clicks" Daily Activity  Outcome  Measure Difficulty turning over in bed (including adjusting bedclothes, sheets and blankets)?: A Little Difficulty moving from lying on back to sitting on the side of the bed? : A Little Difficulty sitting down on and standing up from a chair with arms (e.g., wheelchair, bedside commode, etc,.)?: A Little Help needed moving to and from a bed to chair (including a wheelchair)?: A Little Help needed walking in hospital room?: A Little Help needed climbing 3-5 steps with a railing? : A Lot 6 Click Score: 17    End of Session Equipment Utilized During Treatment: Gait belt;Right knee immobilizer Activity Tolerance: Patient tolerated treatment well Patient left: in chair;with call bell/phone within reach;with family/visitor present Nurse Communication: Mobility status PT Visit Diagnosis: Unsteadiness on feet (R26.81)    Time: 1725-1746 PT Time Calculation (min) (ACUTE ONLY): 21 min   Charges:   PT Evaluation $PT Eval Low Complexity: 1 Low     PT G Codes:   PT G-Codes **NOT FOR INPATIENT CLASS** Functional Assessment Tool Used: Clinical judgement Functional Limitation: Mobility: Walking and moving around Mobility: Walking and Moving Around Current Status (A4536): At least 20 percent but less than 40 percent impaired, limited or restricted Mobility: Walking and Moving Around Goal Status [phone]): At least 1 percent but less than 20 percent impaired, limited or restricted   [facility] PT [phone]   Claretha Cooper 03/30/2017, 5:54 PM

## 2017-03-30 NOTE — H&P (View-Only) (Signed)
Dennis Griffin DOB: 10-Apr-1931 Married / Language: English / Race: White Male Date of Admission:  03/30/2017 CC: right knee pain History of Present Illness The patient is a 83 year old male who comes in for a preoperative History and Physical. The patient is scheduled for a right unicompartmental knee arthroplasty to be performed by Dr. Dione Plover. Aluisio, MD at [facility] on 03/30/2017. The patient is a 83 year old male who presented for follow up of their knee. The patient is being followed for their right knee pain and osteoarthritis. They are now months out from s/p monovisc. Symptoms reported include: pain. The patient feels that they are doing poorly and report their pain level to be severe (with first steps). The following medication has been used for pain control: none. Note for "Follow-up Knee": He states that unfortunately Monovisc was not very successful. His RIGHT knee is an progressively worse. All the pain is on the medial side. He is not getting swelling. The knee does not give out on him. It is hurting with all activities now hurting at rest also appears limiting what he can and cannot do. His attestation hour feels like he needs more permanent solution to his problem. He is not having any associated hip or lower back pain with this. He would like to go ahead and get the right knee fixed at this time. They have been treated conservatively in the past for the above stated problem and despite conservative measures, they continue to have progressive pain and severe functional limitations and dysfunction. They have failed non-operative management including home exercise, medications, and injections. It is felt that they would benefit from undergoing unicompartmental knee replacement. Risks and benefits of the procedure have been discussed with the patient and they elect to proceed with surgery. There are no active contraindications to surgery such as ongoing infection or rapidly  progressive neurological disease.   Problem List/Past Medical  Primary osteoarthritis of right knee (M17.11)  High blood pressure  Gout   Allergies  PenicillAMINE *Miscellaneous Therapeutic Classes  Rash.  Family History Father  Deceased. Mother  Deceased.  Social History Children  2 Current work status  retired Furniture conservator/restorer daily; does running / walking Living situation  live with spouse Marital status  married Never consumed alcohol  10/13/2016: Never consumed alcohol No history of drug/alcohol rehab  Not under pain contract  Number of flights of stairs before winded  4-5 Tobacco / smoke exposure  10/13/2016: no Tobacco use  Never smoker. 10/13/2016 Post-Surgical Plans  Home With Caregiver.  Medication History  Zocor (Oral) Specific strength unknown - Active. Cardura (Oral) Specific strength unknown - Active. Allopurinol (300MG  Tablet, Oral) Active. Vitamin D2 Active.  Past Surgical History  Spinal Surgery  Three times ORIF Right Wrist  Total Knee Replacement - Left  Date: 2003. Dr. Ronnie Derby   Review of Systems  General Not Present- Chills, Fatigue, Fever, Memory Loss, Night Sweats, Weight Gain and Weight Loss. Skin Not Present- Eczema, Hives, Itching, Lesions and Rash. HEENT Not Present- Dentures, Double Vision, Headache, Hearing Loss, Tinnitus and Visual Loss. Respiratory Not Present- Allergies, Chronic Cough, Coughing up blood, Shortness of breath at rest and Shortness of breath with exertion. Cardiovascular Not Present- Chest Pain, Difficulty Breathing Lying Down, Murmur, Palpitations, Racing/skipping heartbeats and Swelling. Gastrointestinal Not Present- Abdominal Pain, Bloody Stool, Constipation, Diarrhea, Difficulty Swallowing, Heartburn, Jaundice, Loss of appetitie, Nausea and Vomiting. Male Genitourinary Not Present- Blood in Urine, Discharge, Flank Pain, Incontinence, Painful Urination, Urgency,  Urinary frequency, Urinary  Retention, Urinating at Night and Weak urinary stream. Musculoskeletal Present- Joint Pain. Not Present- Back Pain, Joint Swelling, Morning Stiffness, Muscle Pain, Muscle Weakness and Spasms. Neurological Not Present- Blackout spells, Difficulty with balance, Dizziness, Paralysis, Tremor and Weakness. Psychiatric Not Present- Insomnia.  Vitals  Weight: 170 lb Height: 67in Weight was reported by patient. Height was reported by patient. Body Surface Area: 1.89 m Body Mass Index: 26.63 kg/m  Pulse: 68 (Regular)  BP: 128/66 (Sitting, Right Arm, Standard)  Physical Exam General Mental Status -Alert, cooperative and good historian. General Appearance-pleasant, Not in acute distress. Orientation-Oriented X3. Build & Nutrition-Well nourished and Well developed.  Head and Neck Head-normocephalic, atraumatic . Neck Global Assessment - supple, no bruit auscultated on the right, no bruit auscultated on the left.  Eye Pupil - Bilateral-Regular and Round. Motion - Bilateral-EOMI.  ENMT Note: upper dentures   Chest and Lung Exam Auscultation Breath sounds - clear at anterior chest wall and clear at posterior chest wall. Adventitious sounds - No Adventitious sounds.  Cardiovascular Auscultation Rhythm - Regular rate and rhythm. Heart Sounds - S1 WNL and S2 WNL. Murmurs & Other Heart Sounds - Auscultation of the heart reveals - No Murmurs.  Abdomen Palpation/Percussion Tenderness - Abdomen is non-tender to palpation. Rigidity (guarding) - Abdomen is soft. Auscultation Auscultation of the abdomen reveals - Bowel sounds normal.  Male Genitourinary Note: Not done, not pertinent to present illness   Musculoskeletal Note: Evaluation of the left hip shows flexion to 120 rotation in 30 out 40 and abduction 40 without discomfort. There is no tenderness over the greater trochanter. There is no pain on provocative testing of the hip.Examination of the right hip  shows flexion to 120 rotation in 30 abduction 40 and external rotation of 40. There is no tenderness over the greater trochanter. There is no pain on provocative testing of the hip. His RIGHT knee shows no effusion. Range of motion of the RIGHT knee is 0-125. There is no crepitus or range of motion. He is very tender along the medial joint line. Is no lateral joint line tenderness or any instability. Pulses sensation and motor intact distally. He has an antalgic gait pattern on the RIGHT.  We again reviewed his radiographs today and he has near bone-on-bone are essentially bone-on-bone in the medial compartment with minimal to no patellofemoral or lateral involvement   Assessment & Plan  Primary osteoarthritis of right knee (M17.11)  Note:Surgical Plans: Right Unicompartmental Knee Replacement  Disposition: Home, Start with [place]  PCP: Dr. Leanna Battles - pending  IV TXA  Anesthesia Issues: General affected the memory following last surgery  Patient was instructed on what medications to stop prior to surgery.  Signed electronically by Joelene Millin, III PA-C

## 2017-03-31 ENCOUNTER — Encounter (HOSPITAL_COMMUNITY): Payer: Self-pay | Admitting: Orthopedic Surgery

## 2017-03-31 DIAGNOSIS — M1710 Unilateral primary osteoarthritis, unspecified knee: Secondary | ICD-10-CM | POA: Diagnosis not present

## 2017-03-31 LAB — BASIC METABOLIC PANEL
Anion gap: 9 (ref 5–15)
BUN: 19 mg/dL (ref 6–20)
CALCIUM: 8.9 mg/dL (ref 8.9–10.3)
CHLORIDE: 106 mmol/L (ref 101–111)
CO2: 24 mmol/L (ref 22–32)
CREATININE: 1.31 mg/dL — AB (ref 0.61–1.24)
GFR, EST AFRICAN AMERICAN: 56 mL/min — AB (ref >60)
GFR, EST NON AFRICAN AMERICAN: 48 mL/min — AB (ref >60)
Glucose, Bld: 130 mg/dL — ABNORMAL HIGH (ref 65–99)
Potassium: 4.1 mmol/L (ref 3.5–5.1)
SODIUM: 139 mmol/L (ref 135–145)

## 2017-03-31 LAB — CBC
HCT: 33.4 % — ABNORMAL LOW (ref 39.0–52.0)
HEMOGLOBIN: 10.9 g/dL — AB (ref 13.0–17.0)
MCH: 29.5 pg (ref 26.0–34.0)
MCHC: 32.6 g/dL (ref 30.0–36.0)
MCV: 90.3 fL (ref 78.0–100.0)
PLATELETS: 127 10*3/uL — AB (ref 150–400)
RBC: 3.7 MIL/uL — ABNORMAL LOW (ref 4.22–5.81)
RDW: 14 % (ref 11.5–15.5)
WBC: 8.5 10*3/uL (ref 4.0–10.5)

## 2017-03-31 MED ORDER — RIVAROXABAN 10 MG PO TABS: 10.0000 mg | ORAL_TABLET | Freq: Every day | ORAL | 0 refills | Status: DC

## 2017-03-31 MED ORDER — OXYCODONE HCL 5 MG PO TABS: 5.0000 mg | ORAL_TABLET | ORAL | 0 refills | Status: DC | PRN

## 2017-03-31 MED ORDER — METHOCARBAMOL 500 MG PO TABS: 500.0000 mg | ORAL_TABLET | Freq: Four times a day (QID) | ORAL | 0 refills | Status: DC | PRN

## 2017-03-31 MED ORDER — TRAMADOL HCL 50 MG PO TABS: 50.0000 mg | ORAL_TABLET | Freq: Four times a day (QID) | ORAL | 0 refills | Status: DC | PRN

## 2017-03-31 NOTE — Progress Notes (Signed)
Discharge planning, spoke with patient and spouse at bedside. Have chosen [facility] for [facility]. Contacted [facility] for referral. Has RW and 3n1, requesting a cane, contacted [facility] to deliver to room. [phone]

## 2017-03-31 NOTE — Progress Notes (Signed)
Physical Therapy Treatment Patient Details Name: Dennis Griffin MRN: [id number] DOB: February 25, 1931 Today's Date: 03/31/2017    History of Present Illness R UKR    PT Comments    POD # 1 am session Assisted with amb and transfers plus performed all supine TE's following HEP handout.  Instructed on proper tech, freq as well as use of ICE. Pt ready for D/C to home.   Follow Up Recommendations  Home health PT;DC plan and follow up therapy as arranged by surgeon     Equipment Recommendations  Rolling walker with 5" wheels    Recommendations for Other Services       Precautions / Restrictions Precautions Precautions: Fall;Knee Precaution Comments: verbally reviewed no pillow under knee  Required Braces or Orthoses: Knee Immobilizer - Right Restrictions Weight Bearing Restrictions: No Other Position/Activity Restrictions: WBAT    Mobility  Bed Mobility               General bed mobility comments: OOB in recliner   Transfers Overall transfer level: Needs assistance Equipment used: Rolling walker (2 wheeled) Transfers: Sit to/from Stand Sit to Stand: Supervision;Min guard         General transfer comment: for safety; cues for hand placement   Ambulation/Gait Ambulation/Gait assistance: Supervision;Min guard Ambulation Distance (Feet): 75 Feet Assistive device: Rolling walker (2 wheeled) Gait Pattern/deviations: Step-to pattern;Step-through pattern Gait velocity: decreased   General Gait Details: cues for sequence esp with turns   Stairs Stairs:  (no stairs)          Wheelchair Mobility    Modified Rankin (Stroke Patients Only)       Balance                                            Cognition Arousal/Alertness: Awake/alert Behavior During Therapy: WFL for tasks assessed/performed Overall Cognitive Status: Within Functional Limits for tasks assessed                                        Exercises   [facility]   Knee Replacement TE's 10 reps B LE ankle pumps 10 reps towel squeezes 10 reps knee presses 10 reps heel slides  10 reps SAQ's 10 reps SLR's 10 reps ABD Followed by ICE    General Comments        Pertinent Vitals/Pain Pain Assessment: 0-10 Pain Score: 4  Pain Location: r knee Pain Descriptors / Indicators: Aching;Guarding;Grimacing;Operative site guarding Pain Intervention(s): Monitored during session;Premedicated before session;Repositioned;Ice applied    Home Living                      Prior Function            PT Goals (current goals can now be found in the care plan section) Progress towards PT goals: Progressing toward goals    Frequency    7X/week      PT Plan Current plan remains appropriate    Co-evaluation              AM-PAC PT "6 Clicks" Daily Activity  Outcome Measure  Difficulty turning over in bed (including adjusting bedclothes, sheets and blankets)?: A Little Difficulty moving from lying on back to sitting on the side of the bed? : A Little Difficulty sitting  down on and standing up from a chair with arms (e.g., wheelchair, bedside commode, etc,.)?: A Little Help needed moving to and from a bed to chair (including a wheelchair)?: A Little Help needed walking in hospital room?: A Little Help needed climbing 3-5 steps with a railing? : A Lot 6 Click Score: 17    End of Session Equipment Utilized During Treatment: Gait belt;Right knee immobilizer Activity Tolerance: Patient tolerated treatment well Patient left: in chair;with call bell/phone within reach;with family/visitor present Nurse Communication:  (pt ready for D/C to home) PT Visit Diagnosis: Unsteadiness on feet (R26.81)     Time: 9826-4158 PT Time Calculation (min) (ACUTE ONLY): 26 min  Charges:  $Gait Training: 8-22 mins $Therapeutic Exercise: 8-22 mins                    G Codes:       Rica Koyanagi  PTA [facility]  Acute  Rehab Pager      [phone]

## 2017-03-31 NOTE — Care Management Obs Status (Signed)
MEDICARE OBSERVATION STATUS NOTIFICATION   Patient Details  Name: Dennis Griffin MRN: [id number] Date of Birth: [birth year]   Medicare Observation Status Notification Given:  Yes    Guadalupe Maple, RN 03/31/2017, 11:02 AM

## 2017-03-31 NOTE — Evaluation (Signed)
Occupational Therapy Evaluation Patient Details Name: Dennis Griffin MRN: [id number] DOB: Aug 28, 1930 Today's Date: 03/31/2017    History of Present Illness R UKR   Clinical Impression   This 83 y/o M presents with the above. At baseline Pt is independent with ADLs and functional mobility. Pt completed functional mobility with MinGuard assist at RW level, requires [place] for LB ADLs. Pt will return home with spouse who is able to assist with ADLs PRN. Education provided and questions answered throughout session. No further acute OT needs identified at this time.     Follow Up Recommendations  DC plan and follow up therapy as arranged by surgeon;Supervision/Assistance - 24 hour    Equipment Recommendations  None recommended by OT           Precautions / Restrictions Precautions Precautions: Fall;Knee Precaution Comments: verbally reviewed no pillow under knee  Required Braces or Orthoses: Knee Immobilizer - Right Restrictions Weight Bearing Restrictions: No      Mobility Bed Mobility               General bed mobility comments: OOB in recliner   Transfers Overall transfer level: Needs assistance Equipment used: Rolling walker (2 wheeled) Transfers: Sit to/from Stand Sit to Stand: Min guard         General transfer comment: for safety; cues for hand placement                                                ADL either performed or assessed with clinical judgement   ADL Overall ADL's : Needs assistance/impaired Eating/Feeding: Set up;Sitting   Grooming: Min guard;Standing   Upper Body Bathing: Min guard;Sitting   Lower Body Bathing: Minimal assistance;Sit to/from stand   Upper Body Dressing : Min guard;Sitting   Lower Body Dressing: Moderate assistance;Sit to/from stand   Toilet Transfer: Min guard;Ambulation;BSC;RW Toilet Transfer Details (indicate cue type and reason): BSC over toilet  Toileting- Clothing Manipulation and Hygiene:  Min guard;Sit to/from stand   Tub/ Shower Transfer: Walk-in shower;Min guard;Cueing for sequencing;Ambulation;3 in 1;Rolling walker   Functional mobility during ADLs: Min guard;Rolling walker General ADL Comments: educated on compensatory techniques for completing ADLs                          Pertinent Vitals/Pain Pain Assessment: Faces Faces Pain Scale: Hurts little more Pain Location: r knee Pain Descriptors / Indicators: Aching;Guarding;Grimacing Pain Intervention(s): Limited activity within patient's tolerance;Monitored during session;Repositioned;Ice applied     Extremity/Trunk Assessment Upper Extremity Assessment Upper Extremity Assessment: Overall WFL for tasks assessed   Lower Extremity Assessment Lower Extremity Assessment: Defer to PT evaluation   Cervical / Trunk Assessment Cervical / Trunk Assessment: Normal   Communication Communication Communication: No difficulties   Cognition Arousal/Alertness: Awake/alert Behavior During Therapy: WFL for tasks assessed/performed Overall Cognitive Status: Within Functional Limits for tasks assessed                                                      Home Living Family/patient expects to be discharged to:: Private residence Living Arrangements: Spouse/significant other Available Help at Discharge: Family Type of Home: House Home Access: Level entry  Home Layout: One level     Bathroom Shower/Tub: Occupational psychologist: Standard     Home Equipment: Environmental consultant - 2 wheels;Bedside commode          Prior Functioning/Environment Level of Independence: Independent                 OT Problem List: Decreased strength;Decreased range of motion;Decreased activity tolerance;Decreased knowledge of use of DME or AE;Decreased knowledge of precautions            OT Goals(Current goals can be found in the care plan section) Acute Rehab OT Goals Patient Stated Goal: to  mow OT Goal Formulation: With patient Time For Goal Achievement: 04/07/17 Potential to Achieve Goals: Good                                 AM-PAC PT "6 Clicks" Daily Activity     Outcome Measure Help from another person eating meals?: None Help from another person taking care of personal grooming?: A Little Help from another person toileting, which includes using toliet, bedpan, or urinal?: A Little Help from another person bathing (including washing, rinsing, drying)?: A Little Help from another person to put on and taking off regular upper body clothing?: None Help from another person to put on and taking off regular lower body clothing?: A Lot 6 Click Score: 19   End of Session Equipment Utilized During Treatment: Gait belt;Rolling walker Nurse Communication: Mobility status  Activity Tolerance: Patient tolerated treatment well Patient left: in chair;with call bell/phone within reach;with family/visitor present  OT Visit Diagnosis: Other abnormalities of gait and mobility (R26.89);Pain Pain - Right/Left: Right Pain - part of body: Knee                Time: 0850-0910 OT Time Calculation (min): 20 min Charges:  OT General Charges $OT Visit: 1 Visit OT Evaluation $OT Eval Low Complexity: 1 Low G-Codes: OT G-codes **NOT FOR INPATIENT CLASS** Functional Assessment Tool Used: AM-PAC 6 Clicks Daily Activity;Clinical judgement Functional Limitation: Self care Self Care Current Status (N3976): At least 20 percent but less than 40 percent impaired, limited or restricted Self Care Goal Status (B3419): At least 20 percent but less than 40 percent impaired, limited or restricted Self Care Discharge Status [phone]): At least 20 percent but less than 40 percent impaired, limited or restricted   Lou Cal, OT Pager [phone] 03/31/2017   Raymondo Band 03/31/2017, 10:04 AM

## 2017-03-31 NOTE — Progress Notes (Signed)
   Subjective: 1 Day Post-Op Procedure(s) (LRB): Right knee medial unicompartmental arthroplasty (Right) Patient reports pain as mild.   Patient seen in rounds by Dr. Wynelle Link. Patient is well, but has had some minor complaints of pain in the knee, requiring pain medications Patient is ready to go home following therapy  Objective: Vital signs in last 24 hours: Temp:  [97.3 F (36.3 C)-98.4 F (36.9 C)] 97.8 F (36.6 C) (09/27 0617) Pulse Rate:  [48-82] 54 (09/27 0617) Resp:  [6-23] 17 (09/27 0617) BP: (100-178)/(53-77) 137/58 (09/27 0617) SpO2:  [95 %-100 %] 98 % (09/27 0617) Weight:  [77.6 kg (171 lb)] 77.6 kg (171 lb) (09/26 1454)  Intake/Output from previous day:  Intake/Output Summary (Last 24 hours) at 03/31/17 0753 Last data filed at 03/31/17 0617  Gross per 24 hour  Intake           3043.5 ml  Output             1710 ml  Net           1333.5 ml    Intake/Output this shift: No intake/output data recorded.  Labs:  Recent Labs  03/31/17 0641  HGB 10.9*    Recent Labs  03/31/17 0641  WBC 8.5  RBC 3.70*  HCT 33.4*  PLT 127*    Recent Labs  03/31/17 0641  NA 139  K 4.1  CL 106  CO2 24  BUN 19  CREATININE 1.31*  GLUCOSE 130*  CALCIUM 8.9   No results for input(s): LABPT, INR in the last 72 hours.  EXAM: General - Patient is Alert, Appropriate and Oriented Extremity - Neurovascular intact Sensation intact distally Intact pulses distally Dorsiflexion/Plantar flexion intact Dressing - clean, dry, no drainage Motor Function - intact, moving foot and toes well on exam.  Hemovac pulled without difficulty.  Assessment/Plan: 1 Day Post-Op Procedure(s) (LRB): Right knee medial unicompartmental arthroplasty (Right) Procedure(s) (LRB): Right knee medial unicompartmental arthroplasty (Right) Past Medical History:  Diagnosis Date  . BNC (bladder neck contracture)   . BPH (benign prostatic hypertrophy)   . DDD (degenerative disc disease), lumbar     . Frequency of urination   . Hyperlipemia   . Hypertension   . Nocturia   . PONV (postoperative nausea and vomiting) 1973   after ORIF rt arm  . Spinal stenosis   . Urgency of urination    Principal Problem:   OA (osteoarthritis) of knee  Estimated body mass index is 26.78 kg/m as calculated from the following:   Height as of this encounter: 5\' 7"  (1.702 m).   Weight as of this encounter: 77.6 kg (171 lb). Up with therapy Discharge home with home health Diet - Cardiac diet Follow up - in 2 weeks Activity - WBAT Dressing - May remove the surgical dressing tomorrow at home and then apply a dry gauze dressing daily. May shower three days following surgery but do not submerge the incision under water. Disposition - Home Condition Upon Discharge - home if therapy goals D/C Meds - See DC Summary DVT Prophylaxis Xarelto 10 mg daily for ten days, then change to Aspirin 325 mg daily for two weeks, then reduce to Baby Aspirin 81 mg daily for three additional weeks.  Arlee Muslim, PA-C Orthopaedic Surgery 03/31/2017, 7:53 AM

## 2017-03-31 NOTE — Discharge Summary (Signed)
Physician Discharge Summary   Patient ID: Dennis Griffin MRN: [id number] DOB/AGE: Dec 21, 1930 83 y.o.  Admit date: 03/30/2017 Discharge date:   Primary Diagnosis: Medial compartment osteoarthritis, Right knee  Admission Diagnoses:  Past Medical History:  Diagnosis Date  . BNC (bladder neck contracture)   . BPH (benign prostatic hypertrophy)   . DDD (degenerative disc disease), lumbar   . Frequency of urination   . Hyperlipemia   . Hypertension   . Nocturia   . PONV (postoperative nausea and vomiting) 1973   after ORIF rt arm  . Spinal stenosis   . Urgency of urination    Discharge Diagnoses:   Principal Problem:   OA (osteoarthritis) of knee  Estimated body mass index is 26.78 kg/m as calculated from the following:   Height as of this encounter: '5\' 7"'$  (1.702 m).   Weight as of this encounter: 77.6 kg (171 lb).  Procedure:  Procedure(s) (LRB): Right knee medial unicompartmental arthroplasty (Right)   Consults: None  HPI: Dennis Griffin is a 83 y.o. male, who has  significant isolated medial compartment arthritis of the Right knee. The patient has had nonoperative management including injections of cortisone and viscous supplements. Unfortunately, the pain persists.  Radiograph showed isolated medial compartment bone-on-bone arthritis  with normal-appearing patellofemoral and lateral compartments. The patient presents now for left knee unicompartmental arthroplasty.   Laboratory Data: Admission on 03/30/2017  Component Date Value Ref Range Status  . WBC 03/31/2017 8.5  4.0 - 10.5 K/uL Final  . RBC 03/31/2017 3.70* 4.22 - 5.81 MIL/uL Final  . Hemoglobin 03/31/2017 10.9* 13.0 - 17.0 g/dL Final  . HCT 03/31/2017 33.4* 39.0 - 52.0 % Final  . MCV 03/31/2017 90.3  78.0 - 100.0 fL Final  . MCH 03/31/2017 29.5  26.0 - 34.0 pg Final  . MCHC 03/31/2017 32.6  30.0 - 36.0 g/dL Final  . RDW 03/31/2017 14.0  11.5 - 15.5 % Final  . Platelets 03/31/2017 127* 150 - 400 K/uL  Final  . Sodium 03/31/2017 139  135 - 145 mmol/L Final  . Potassium 03/31/2017 4.1  3.5 - 5.1 mmol/L Final  . Chloride 03/31/2017 106  101 - 111 mmol/L Final  . CO2 03/31/2017 24  22 - 32 mmol/L Final  . Glucose, Bld 03/31/2017 130* 65 - 99 mg/dL Final  . BUN 03/31/2017 19  6 - 20 mg/dL Final  . Creatinine, Ser 03/31/2017 1.31* 0.61 - 1.24 mg/dL Final  . Calcium 03/31/2017 8.9  8.9 - 10.3 mg/dL Final  . GFR calc non Af Amer 03/31/2017 48* >60 mL/min Final  . GFR calc Af Amer 03/31/2017 56* >60 mL/min Final   Comment: (NOTE) The eGFR has been calculated using the CKD EPI equation. This calculation has not been validated in all clinical situations. eGFR's persistently <60 mL/min signify possible Chronic Kidney Disease.   Georgiann Hahn gap 03/31/2017 9  5 - 15 Final  Hospital Outpatient Visit on 03/21/2017  Component Date Value Ref Range Status  . aPTT 03/21/2017 27  24 - 36 seconds Final  . WBC 03/21/2017 5.0  4.0 - 10.5 K/uL Final  . RBC 03/21/2017 4.37  4.22 - 5.81 MIL/uL Final  . Hemoglobin 03/21/2017 12.8* 13.0 - 17.0 g/dL Final  . HCT 03/21/2017 39.0  39.0 - 52.0 % Final  . MCV 03/21/2017 89.2  78.0 - 100.0 fL Final  . MCH 03/21/2017 29.3  26.0 - 34.0 pg Final  . MCHC 03/21/2017 32.8  30.0 - 36.0 g/dL Final  .  RDW 03/21/2017 14.0  11.5 - 15.5 % Final  . Platelets 03/21/2017 127* 150 - 400 K/uL Final  . Sodium 03/21/2017 140  135 - 145 mmol/L Final  . Potassium 03/21/2017 4.1  3.5 - 5.1 mmol/L Final  . Chloride 03/21/2017 105  101 - 111 mmol/L Final  . CO2 03/21/2017 27  22 - 32 mmol/L Final  . Glucose, Bld 03/21/2017 96  65 - 99 mg/dL Final  . BUN 03/21/2017 21* 6 - 20 mg/dL Final  . Creatinine, Ser 03/21/2017 1.34* 0.61 - 1.24 mg/dL Final  . Calcium 03/21/2017 9.3  8.9 - 10.3 mg/dL Final  . Total Protein 03/21/2017 6.8  6.5 - 8.1 g/dL Final  . Albumin 03/21/2017 4.5  3.5 - 5.0 g/dL Final  . AST 03/21/2017 20  15 - 41 U/L Final  . ALT 03/21/2017 20  17 - 63 U/L Final  .  Alkaline Phosphatase 03/21/2017 76  38 - 126 U/L Final  . Total Bilirubin 03/21/2017 0.9  0.3 - 1.2 mg/dL Final  . GFR calc non Af Amer 03/21/2017 47* >60 mL/min Final  . GFR calc Af Amer 03/21/2017 54* >60 mL/min Final   Comment: (NOTE) The eGFR has been calculated using the CKD EPI equation. This calculation has not been validated in all clinical situations. eGFR's persistently <60 mL/min signify possible Chronic Kidney Disease.   . Anion gap 03/21/2017 8  5 - 15 Final  . Prothrombin Time 03/21/2017 12.0  11.4 - 15.2 seconds Final  . INR 03/21/2017 0.90   Final  . ABO/RH(D) 03/21/2017 O POS   Final  . Antibody Screen 03/21/2017 NEG   Final  . Sample Expiration 03/21/2017 04/02/2017   Final  . Extend sample reason 03/21/2017 NO TRANSFUSIONS OR PREGNANCY IN THE PAST 3 MONTHS   Final  . MRSA, PCR 03/21/2017 NEGATIVE  NEGATIVE Final  . Staphylococcus aureus 03/21/2017 NEGATIVE  NEGATIVE Final   Comment: (NOTE) The Xpert SA Assay (FDA approved for NASAL specimens in patients 72 years of age and older), is one component of a comprehensive surveillance program. It is not intended to diagnose infection nor to guide or monitor treatment.   . ABO/RH(D) 03/21/2017 O POS   Final     X-Rays:No results found.  EKG: Orders placed or performed during the hospital encounter of 03/21/17  . EKG 12 lead  . EKG 12 lead     Hospital Course: Dennis Griffin is a 83 y.o. who was admitted to [facility]. They were brought to the operating room on 03/30/2017 and underwent Procedure(s): Right knee medial unicompartmental arthroplasty.  Patient tolerated the procedure well and was later transferred to the recovery room and then to the orthopaedic floor for postoperative care.  They were given PO and IV analgesics for pain control following their surgery.  They were given 24 hours of postoperative antibiotics of  Anti-infectives    Start     Dose/Rate Route Frequency Ordered Stop   03/30/17 1500   ceFAZolin (ANCEF) IVPB 2g/100 mL premix     2 g 200 mL/hr over 30 Minutes Intravenous Every 6 hours 03/30/17 1224 03/30/17 2330   03/30/17 0627  ceFAZolin (ANCEF) 2-4 GM/100ML-% IVPB    Comments:  Waldron Session   : cabinet override      03/30/17 [phone] 03/30/17 0851   03/30/17 0623  ceFAZolin (ANCEF) IVPB 2g/100 mL premix     2 g 200 mL/hr over 30 Minutes Intravenous On call to O.R. 03/30/17 2952 03/30/17  0921     and started on DVT prophylaxis in the form of Xarelto.   PT and OT were ordered for postop therapy protocol.  Discharge planning consulted to help with postop disposition and equipment needs.  Patient had a good night on the evening of surgery.  They started to get up OOB with therapy on day one. Hemovac drain was pulled without difficulty.  Patient was seen in rounds on day one by Dr. Wynelle Link and it was felt that as long as they did well with the remaining sessions of therapy that they would be ready to go home.  Arrangements were made and they were setup to go home on POD 1.  Diet - Cardiac diet Follow up - in 2 weeks Activity - WBAT Dressing - May remove the surgical dressing tomorrow at home and then apply a dry gauze dressing daily. May shower three days following surgery but do not submerge the incision under water. Disposition - Home Condition Upon Discharge - Stable D/C Meds - See DC Summary DVT Prophylaxis Xarelto 10 mg daily for ten days, then change to Aspirin 325 mg daily for two weeks, then reduce to Baby Aspirin 81 mg daily for three additional weeks.  Discharge Instructions    Call MD / Call 911    Complete by:  As directed    If you experience chest pain or shortness of breath, CALL 911 and be transported to the hospital emergency room.  If you develope a fever above 101 F, pus (white drainage) or increased drainage or redness at the wound, or calf pain, call your surgeon's office.   Change dressing    Complete by:  As directed    Change dressing daily with  sterile 4 x 4 inch gauze dressing and apply TED hose. Do not submerge the incision under water.   Constipation Prevention    Complete by:  As directed    Drink plenty of fluids.  Prune juice may be helpful.  You may use a stool softener, such as Colace (over the counter) 100 mg twice a day.  Use MiraLax (over the counter) for constipation as needed.   Diet - low sodium heart healthy    Complete by:  As directed    Discharge instructions    Complete by:  As directed    Xarelto 10 mg daily for ten days, then change to Aspirin 325 mg daily for two weeks, then reduce to Baby Aspirin 81 mg daily for three additional weeks.   Pick up stool softner and laxative for home use following surgery while on pain medications. Do not submerge incision under water. Please use good hand washing techniques while changing dressing each day. May shower starting three days after surgery. Please use a clean towel to pat the incision dry following showers. Continue to use ice for pain and swelling after surgery. Do not use any lotions or creams on the incision until instructed by your surgeon.  Wear both TED hose on both legs during the day every day for three weeks, but may remove the TED hose at night at home.  Postoperative Constipation Protocol  Constipation - defined medically as fewer than three stools per week and severe constipation as less than one stool per week.  One of the most common issues patients have following surgery is constipation.  Even if you have a regular bowel pattern at home, your normal regimen is likely to be disrupted due to multiple reasons following surgery.  Combination of  anesthesia, postoperative narcotics, change in appetite and fluid intake all can affect your bowels.  In order to avoid complications following surgery, here are some recommendations in order to help you during your recovery period.  Colace (docusate) - Pick up an over-the-counter form of Colace or another stool  softener and take twice a day as long as you are requiring postoperative pain medications.  Take with a full glass of water daily.  If you experience loose stools or diarrhea, hold the colace until you stool forms back up.  If your symptoms do not get better within 1 week or if they get worse, check with your doctor.  Dulcolax (bisacodyl) - Pick up over-the-counter and take as directed by the product packaging as needed to assist with the movement of your bowels.  Take with a full glass of water.  Use this product as needed if not relieved by Colace only.   MiraLax (polyethylene glycol) - Pick up over-the-counter to have on hand.  MiraLax is a solution that will increase the amount of water in your bowels to assist with bowel movements.  Take as directed and can mix with a glass of water, juice, soda, coffee, or tea.  Take if you go more than two days without a movement. Do not use MiraLax more than once per day. Call your doctor if you are still constipated or irregular after using this medication for 7 days in a row.  If you continue to have problems with postoperative constipation, please contact the office for further assistance and recommendations.  If you experience "the worst abdominal pain ever" or develop nausea or vomiting, please contact the office immediatly for further recommendations for treatment.   Do not put a pillow under the knee. Place it under the heel.    Complete by:  As directed    Do not sit on low chairs, stoools or toilet seats, as it may be difficult to get up from low surfaces    Complete by:  As directed    Driving restrictions    Complete by:  As directed    No driving until released by the physician.   Increase activity slowly as tolerated    Complete by:  As directed    Lifting restrictions    Complete by:  As directed    No lifting until released by the physician.   Patient may shower    Complete by:  As directed    You may shower without a dressing once there  is no drainage.  Do not wash over the wound.  If drainage remains, do not shower until drainage stops.   TED hose    Complete by:  As directed    Use stockings (TED hose) for 3 weeks on both leg(s).  You may remove them at night for sleeping.   Weight bearing as tolerated    Complete by:  As directed    Laterality:  right   Extremity:  Lower     Allergies as of 03/31/2017      Reactions   Penicillins Rash   Has patient had a PCN reaction causing immediate rash, facial/tongue/throat swelling, SOB or lightheadedness with hypotension: Yes Has patient had a PCN reaction causing severe rash involving mucus membranes or skin necrosis: No Has patient had a PCN reaction that required hospitalization: Yes Has patient had a PCN reaction occurring within the last 10 years: No If all of the above answers are "NO", then may proceed with Cephalosporin use.  Medication List    STOP taking these medications   Vitamin D (Ergocalciferol) 50000 units Caps capsule Commonly known as:  DRISDOL   VITAMIN D PO     TAKE these medications   allopurinol 300 MG tablet Commonly known as:  ZYLOPRIM Take 300 mg by mouth daily.   doxazosin 8 MG tablet Commonly known as:  CARDURA Take 4 mg by mouth daily.   gabapentin 600 MG tablet Commonly known as:  NEURONTIN Take 300-600 mg by mouth daily as needed (depends on pain if takes 300-600 mg).   methocarbamol 500 MG tablet Commonly known as:  ROBAXIN Take 1 tablet (500 mg total) by mouth every 6 (six) hours as needed for muscle spasms.   oxyCODONE 5 MG immediate release tablet Commonly known as:  Oxy IR/ROXICODONE Take 1-2 tablets (5-10 mg total) by mouth every 4 (four) hours as needed for moderate pain or severe pain.   rivaroxaban 10 MG Tabs tablet Commonly known as:  XARELTO Take 1 tablet (10 mg total) by mouth daily with breakfast. Xarelto 10 mg daily for ten days, then change to Aspirin 325 mg daily for two weeks, then reduce to Baby Aspirin  81 mg daily for three additional weeks.   simvastatin 40 MG tablet Commonly known as:  ZOCOR Take 20 mg by mouth daily.   traMADol 50 MG tablet Commonly known as:  ULTRAM Take 1-2 tablets (50-100 mg total) by mouth every 6 (six) hours as needed for moderate pain.            Discharge Care Instructions        Start     Ordered   03/31/17 0000  methocarbamol (ROBAXIN) 500 MG tablet  Every 6 hours PRN    Question:  Supervising Provider  Answer:  Gaynelle Arabian   03/31/17 0757   03/31/17 0000  oxyCODONE (OXY IR/ROXICODONE) 5 MG immediate release tablet  Every 4 hours PRN    Question:  Supervising Provider  Answer:  Gaynelle Arabian   03/31/17 0757   03/31/17 0000  rivaroxaban (XARELTO) 10 MG TABS tablet  Daily with breakfast    Question:  Supervising Provider  Answer:  Gaynelle Arabian   03/31/17 0757   03/31/17 0000  traMADol (ULTRAM) 50 MG tablet  Every 6 hours PRN    Question:  Supervising Provider  Answer:  Gaynelle Arabian   03/31/17 0757   03/31/17 0000  Call MD / Call 911    Comments:  If you experience chest pain or shortness of breath, CALL 911 and be transported to the hospital emergency room.  If you develope a fever above 101 F, pus (white drainage) or increased drainage or redness at the wound, or calf pain, call your surgeon's office.   03/31/17 0757   03/31/17 0000  Discharge instructions    Comments:  Xarelto 10 mg daily for ten days, then change to Aspirin 325 mg daily for two weeks, then reduce to Baby Aspirin 81 mg daily for three additional weeks.   Pick up stool softner and laxative for home use following surgery while on pain medications. Do not submerge incision under water. Please use good hand washing techniques while changing dressing each day. May shower starting three days after surgery. Please use a clean towel to pat the incision dry following showers. Continue to use ice for pain and swelling after surgery. Do not use any lotions or creams on the  incision until instructed by your surgeon.  Wear both TED hose on  both legs during the day every day for three weeks, but may remove the TED hose at night at home.  Postoperative Constipation Protocol  Constipation - defined medically as fewer than three stools per week and severe constipation as less than one stool per week.  One of the most common issues patients have following surgery is constipation.  Even if you have a regular bowel pattern at home, your normal regimen is likely to be disrupted due to multiple reasons following surgery.  Combination of anesthesia, postoperative narcotics, change in appetite and fluid intake all can affect your bowels.  In order to avoid complications following surgery, here are some recommendations in order to help you during your recovery period.  Colace (docusate) - Pick up an over-the-counter form of Colace or another stool softener and take twice a day as long as you are requiring postoperative pain medications.  Take with a full glass of water daily.  If you experience loose stools or diarrhea, hold the colace until you stool forms back up.  If your symptoms do not get better within 1 week or if they get worse, check with your doctor.  Dulcolax (bisacodyl) - Pick up over-the-counter and take as directed by the product packaging as needed to assist with the movement of your bowels.  Take with a full glass of water.  Use this product as needed if not relieved by Colace only.   MiraLax (polyethylene glycol) - Pick up over-the-counter to have on hand.  MiraLax is a solution that will increase the amount of water in your bowels to assist with bowel movements.  Take as directed and can mix with a glass of water, juice, soda, coffee, or tea.  Take if you go more than two days without a movement. Do not use MiraLax more than once per day. Call your doctor if you are still constipated or irregular after using this medication for 7 days in a row.  If you continue to  have problems with postoperative constipation, please contact the office for further assistance and recommendations.  If you experience "the worst abdominal pain ever" or develop nausea or vomiting, please contact the office immediatly for further recommendations for treatment.   03/31/17 0757   03/31/17 0000  Diet - low sodium heart healthy     03/31/17 0757   03/31/17 0000  Constipation Prevention    Comments:  Drink plenty of fluids.  Prune juice may be helpful.  You may use a stool softener, such as Colace (over the counter) 100 mg twice a day.  Use MiraLax (over the counter) for constipation as needed.   03/31/17 0757   03/31/17 0000  Increase activity slowly as tolerated     03/31/17 0757   03/31/17 0000  Patient may shower    Comments:  You may shower without a dressing once there is no drainage.  Do not wash over the wound.  If drainage remains, do not shower until drainage stops.   03/31/17 0757   03/31/17 0000  Weight bearing as tolerated    Question Answer Comment  Laterality right   Extremity Lower      03/31/17 0757   03/31/17 0000  Driving restrictions    Comments:  No driving until released by the physician.   03/31/17 0757   03/31/17 0000  Lifting restrictions    Comments:  No lifting until released by the physician.   03/31/17 0757   03/31/17 0000  TED hose    Comments:  Use stockings (TED hose) for 3 weeks on both leg(s).  You may remove them at night for sleeping.   03/31/17 0757   03/31/17 0000  Change dressing    Comments:  Change dressing daily with sterile 4 x 4 inch gauze dressing and apply TED hose. Do not submerge the incision under water.   03/31/17 0757   03/31/17 0000  Do not put a pillow under the knee. Place it under the heel.     03/31/17 0757   03/31/17 0000  Do not sit on low chairs, stoools or toilet seats, as it may be difficult to get up from low surfaces     03/31/17 0757     Follow-up Information    Gaynelle Arabian, MD. Schedule an  appointment as soon as possible for a visit on 04/12/2017.   Specialty:  Orthopedic Surgery Contact information: [address] [place] [address] [phone]           Signed: Arlee Muslim, PA-C Orthopaedic Surgery 03/31/2017, 7:58 AM

## 2017-04-01 NOTE — Anesthesia Procedure Notes (Signed)
Anesthesia Regional Block: Adductor canal block   Pre-Anesthetic Checklist: ,, timeout performed, Correct Patient, Correct Site, Correct Laterality, Correct Procedure, Correct Position, site marked, Risks and benefits discussed,  Surgical consent,  Pre-op evaluation,  At surgeon's request and post-op pain management  Laterality: Right  Prep: chloraprep       Needles:  Injection technique: Single-shot  Needle Type: Echogenic Stimulator Needle     Needle Length: 9cm  Needle Gauge: 21     Additional Needles:   Narrative:  Start time: 04/01/2017 8:40 AM End time: 04/01/2017 8:55 AM  Performed by: Personally  Anesthesiologist: Lillia Abed  Additional Notes: Monitors applied. Patient sedated. Sterile prep and drape,hand hygiene and sterile gloves were used. Relevant anatomy identified.Needle position confirmed.Local anesthetic injected incrementally after negative aspiration. Local anesthetic spread visualized around nerve(s). Vascular puncture avoided. No complications. Image printed for medical record.The patient tolerated the procedure well.    Lillia Abed MD

## 2017-04-01 NOTE — Addendum Note (Signed)
Addendum  created 04/01/17 0847 by Lillia Abed, MD   Anesthesia Intra Blocks edited, Child order released for a procedure order, Sign clinical note

## 2017-04-13 MED FILL — Bupivacaine Liposome Inj 1.3% (13.3 MG/ML): INTRAMUSCULAR | Qty: 20 | Status: AC

## 2020-11-03 ENCOUNTER — Encounter (HOSPITAL_BASED_OUTPATIENT_CLINIC_OR_DEPARTMENT_OTHER): Payer: Self-pay | Admitting: *Deleted

## 2020-11-03 ENCOUNTER — Emergency Department (HOSPITAL_BASED_OUTPATIENT_CLINIC_OR_DEPARTMENT_OTHER)
Admission: EM | Admit: 2020-11-03 | Discharge: 2020-11-03 | Disposition: A | Discharge status: Home / Self Care | Payer: Medicare Other | Attending: Emergency Medicine | Admitting: Emergency Medicine

## 2020-11-03 ENCOUNTER — Emergency Department (HOSPITAL_BASED_OUTPATIENT_CLINIC_OR_DEPARTMENT_OTHER): Payer: Medicare Other

## 2020-11-03 ENCOUNTER — Other Ambulatory Visit: Payer: Self-pay

## 2020-11-03 DIAGNOSIS — R059 Cough, unspecified: Secondary | ICD-10-CM | POA: Diagnosis present

## 2020-11-03 DIAGNOSIS — Z96652 Presence of left artificial knee joint: Secondary | ICD-10-CM | POA: Diagnosis not present

## 2020-11-03 DIAGNOSIS — Z79899 Other long term (current) drug therapy: Secondary | ICD-10-CM | POA: Diagnosis not present

## 2020-11-03 DIAGNOSIS — I10 Essential (primary) hypertension: Secondary | ICD-10-CM | POA: Insufficient documentation

## 2020-11-03 DIAGNOSIS — Z87891 Personal history of nicotine dependence: Secondary | ICD-10-CM | POA: Insufficient documentation

## 2020-11-03 DIAGNOSIS — Z20822 Contact with and (suspected) exposure to covid-19: Secondary | ICD-10-CM | POA: Diagnosis not present

## 2020-11-03 DIAGNOSIS — J069 Acute upper respiratory infection, unspecified: Secondary | ICD-10-CM | POA: Insufficient documentation

## 2020-11-03 LAB — RESP PANEL BY RT-PCR (FLU A&B, COVID) ARPGX2
Influenza A by PCR: NEGATIVE
Influenza B by PCR: NEGATIVE
SARS Coronavirus 2 by RT PCR: NEGATIVE

## 2020-11-03 MED ORDER — AZITHROMYCIN 250 MG PO TABS: 250.0000 mg | ORAL_TABLET | Freq: Every day | ORAL | 0 refills | Status: DC

## 2020-11-03 NOTE — ED Triage Notes (Signed)
Coughing for 2-3 days.  Denies fever, n/v.

## 2020-11-03 NOTE — ED Notes (Signed)
Pt discharged home after verbalizing understanding of discharge instructions; nad noted. 

## 2020-11-03 NOTE — Discharge Instructions (Addendum)
Follow-up with your primary care doctor.  Return to ER if you develop difficulty breathing, chest pain, fever or other new concerning symptom.

## 2020-11-03 NOTE — ED Provider Notes (Signed)
[facility] EMERGENCY DEPT Provider Note   CSN: [id number] Arrival date & time: 11/03/20  0953     History Chief Complaint  Patient presents with  . Cough    Dennis Griffin is a 87 y.o. male.  Presented to the emergency room with concern for cough.  Patient reports that over the past few days to 1 week he has had cough.  Occasionally productive with yellow sputum.  Also having some nasal congestion.  No chest pain or difficulty breathing.  No fever.  States that he has had the COVID-vaccine including 2 boosters.  HPI     Past Medical History:  Diagnosis Date  . BNC (bladder neck contracture)   . BPH (benign prostatic hypertrophy)   . DDD (degenerative disc disease), lumbar   . Frequency of urination   . Hyperlipemia   . Hypertension   . Nocturia   . PONV (postoperative nausea and vomiting) 1973   after ORIF rt arm  . Spinal stenosis   . Urgency of urination     Patient Active Problem List   Diagnosis Date Noted  . OA (osteoarthritis) of knee 03/30/2017  . TUBULOVILLOUS ADENOMA, 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  . DIVERTICULOSIS, 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    Past Surgical History:  Procedure Laterality Date  . BACK SURGERY    . CATARACT EXTRACTION W/ INTRAOCULAR LENS  IMPLANT, BILATERAL    . EYE SURGERY    . HEMIARTHROPLASTY SHOULDER FRACTURE  1996   RIGHT--  TRAMATIC INJURY/ AVASCULAR NECROSIS  . JOINT REPLACEMENT    . KNEE ARTHROSCOPY  2002   LEFT  . LUMBAR FUSION  05-08-2009   L5 - S1  . ORIF ARM FX  1973  . PARTIAL KNEE ARTHROPLASTY Right 03/30/2017   Procedure: Right knee medial unicompartmental arthroplasty;  Surgeon: Gaynelle Arabian, MD;  Location: [facility];  Service: Orthopedics;  Laterality: Right;  . TONSILLECTOMY  1942  . TOTAL KNEE ARTHROPLASTY  09-04-2001   LEFT  . TRANSURETHRAL RESECTION OF PROSTATE  26 YRS AGO   AND REMOVAL OF PROSTATIC STONE  . TRANSURETHRAL RESECTION OF PROSTATE  09/20/2011   Procedure: TRANSURETHRAL RESECTION OF THE PROSTATE WITH GYRUS  INSTRUMENTS;  Surgeon: Molli Hazard, MD;  Location: [facility];  Service: Urology;  Laterality: N/A;       History reviewed. No pertinent family history.  Social History   Tobacco Use  . Smoking status: Former Smoker    Years: 15.00    Types: Cigarettes    Quit date: 07/23/1964    Years since quitting: 56.3  . Smokeless tobacco: Never Used  Substance Use Topics  . Alcohol use: No  . Drug use: No    Home Medications Prior to Admission medications   Medication Sig Start Date End Date Taking? Authorizing Provider  azithromycin (ZITHROMAX) 250 MG tablet Take 1 tablet (250 mg total) by mouth daily. Take first 2 tablets together, then 1 every day until finished. 11/03/20  Yes Lucrezia Starch, MD  doxazosin (CARDURA) 8 MG tablet Take 4 mg by mouth daily.  01/14/11  Yes [provider]  gabapentin (NEURONTIN) 600 MG tablet Take 300-600 mg by mouth daily as needed (depends on pain if takes 300-600 mg).  12/31/14  Yes [provider]  simvastatin (ZOCOR) 40 MG tablet Take 20 mg by mouth daily.    Yes [provider]    Allergies    Penicillins  Review of Systems   Review of Systems  Constitutional: Negative for chills and  fever.  HENT: Positive for congestion. Negative for ear pain and sore throat.   Eyes: Negative for pain and visual disturbance.  Respiratory: Positive for cough. Negative for shortness of breath.   Cardiovascular: Negative for chest pain and palpitations.  Gastrointestinal: Negative for abdominal pain and vomiting.  Genitourinary: Negative for dysuria and hematuria.  Musculoskeletal: Negative for arthralgias and back pain.  Skin: Negative for color change and rash.  Neurological: Negative for seizures and syncope.  All other systems reviewed and are negative.   Physical Exam Updated Vital Signs BP (!) 169/73 (BP Location: Left Arm)   Pulse 67   Temp 98.6 F (37 C) (Oral)   Resp 16   Ht 5\' 7"  (1.702 m)    Wt 77.1 kg   SpO2 98%   BMI 26.63 kg/m   Physical Exam Vitals and nursing note reviewed.  Constitutional:      Appearance: He is well-developed.  HENT:     Head: Normocephalic and atraumatic.  Eyes:     Conjunctiva/sclera: Conjunctivae normal.  Cardiovascular:     Rate and Rhythm: Normal rate and regular rhythm.     Heart sounds: No murmur heard.   Pulmonary:     Effort: Pulmonary effort is normal. No respiratory distress.     Breath sounds: Normal breath sounds.  Abdominal:     Palpations: Abdomen is soft.     Tenderness: There is no abdominal tenderness.  Musculoskeletal:     Cervical back: Neck supple.  Skin:    General: Skin is warm and dry.  Neurological:     General: No focal deficit present.     Mental Status: He is alert.  Psychiatric:        Mood and Affect: Mood normal.        Behavior: Behavior normal.     ED Results / Procedures / Treatments   Labs (all labs ordered are listed, but only abnormal results are displayed) Labs Reviewed  RESP PANEL BY RT-PCR (FLU A&B, COVID) [id number]    EKG None  Radiology DG Chest Portable 1 View  Result Date: 11/03/2020 CLINICAL DATA:  Cough EXAM: PORTABLE CHEST 1 VIEW COMPARISON:  October 01, 2015 FINDINGS: Areas of suspected pleural plaque in the upper pneumothorax regions are stable. No edema or airspace opacity. Heart is mildly enlarged with pulmonary vascular normal. No adenopathy. Total shoulder replacement noted on the right. IMPRESSION: Apparent pleural plaques in each upper hemithorax region. Question previous asbestos exposure. No edema or airspace opacity. Stable cardiac enlargement. No adenopathy. Electronically Signed   By: Lowella Grip III M.D.   On: 11/03/2020 11:30    Procedures Procedures   Medications Ordered in ED Medications - No data to display  ED Course  I have reviewed the triage vital signs and the nursing notes.  Pertinent labs & imaging results that were available during my care of the  patient were reviewed by me and considered in my medical decision making (see chart for details).    MDM Rules/Calculators/A&P                         87 year old male presenting to the emergency room with concern for cough.  On exam patient is remarkably well-appearing in no distress.  He has stable vital signs, no tachypnea or hypoxia.  CXR is negative for pneumonia.  We will send viral testing for COVID and influenza.  Suspect most likely viral right upper respiratory illness.  Given duration of  symptoms and productive nature of cough, will provide course of antibiotic in case this is bacterial in nature.  Recommend recheck with primary doctor.  Remains well-appearing with stable vitals.  Okay for outpatient management.   After the discussed management above, the patient was determined to be safe for discharge.  The patient was in agreement with this plan and all questions regarding their care were answered.  ED return precautions were discussed and the patient will return to the ED with any significant worsening of condition.   Final Clinical Impression(s) / ED Diagnoses Final diagnoses:  Upper respiratory tract infection, unspecified type    Rx / DC Orders ED Discharge Orders         Ordered    azithromycin (ZITHROMAX) 250 MG tablet  Daily        11/03/20 1138           Lucrezia Starch, MD 11/03/20 1140

## 2023-07-04 ENCOUNTER — Ambulatory Visit (INDEPENDENT_AMBULATORY_CARE_PROVIDER_SITE_OTHER): Payer: Medicare Other

## 2023-07-04 ENCOUNTER — Ambulatory Visit (HOSPITAL_COMMUNITY): Admit: 2023-07-04 | Payer: Medicare Other

## 2023-07-04 ENCOUNTER — Ambulatory Visit
Admission: EM | Admit: 2023-07-04 | Discharge: 2023-07-04 | Disposition: A | Discharge status: Home / Self Care | Payer: Medicare Other | Attending: Family Medicine | Admitting: Family Medicine

## 2023-07-04 DIAGNOSIS — R0989 Other specified symptoms and signs involving the circulatory and respiratory systems: Secondary | ICD-10-CM | POA: Diagnosis not present

## 2023-07-04 DIAGNOSIS — B9689 Other specified bacterial agents as the cause of diseases classified elsewhere: Secondary | ICD-10-CM

## 2023-07-04 DIAGNOSIS — J208 Acute bronchitis due to other specified organisms: Secondary | ICD-10-CM | POA: Diagnosis not present

## 2023-07-04 LAB — POC COVID19/FLU A&B COMBO
Covid Antigen, POC: NEGATIVE
Influenza A Antigen, POC: NEGATIVE
Influenza B Antigen, POC: NEGATIVE

## 2023-07-04 MED ORDER — PROMETHAZINE-DM 6.25-15 MG/5ML PO SYRP: 2.5000 mL | ORAL_SOLUTION | Freq: Three times a day (TID) | ORAL | 0 refills | Status: DC | PRN

## 2023-07-04 MED ORDER — AZITHROMYCIN 250 MG PO TABS: ORAL_TABLET | ORAL | 0 refills | Status: DC

## 2023-07-04 NOTE — ED Provider Notes (Signed)
[facility]  Note:  This document was prepared using Conservation officer, historic buildings and may include unintentional dictation errors.  MRN: [id number] DOB: 06-04-1931  Subjective:   Dennis Griffin is a 89 y.o. male presenting for 2-day history of acute onset coughing, chest congestion and chest pain. Symptoms worsened last night. He also had a sinus headache. No respiratory disorders. No smoking of any kind including cigarettes, cigars, vaping, marijuana use.    No current facility-administered medications for this encounter.  Current Outpatient Medications:    azithromycin (ZITHROMAX) 250 MG tablet, Take 1 tablet (250 mg total) by mouth daily. Take first 2 tablets together, then 1 every day until finished., Disp: 6 tablet, Rfl: 0   doxazosin (CARDURA) 8 MG tablet, Take 4 mg by mouth daily. , Disp: , Rfl:    gabapentin (NEURONTIN) 600 MG tablet, Take 300-600 mg by mouth daily as needed (depends on pain if takes 300-600 mg). , Disp: , Rfl:    simvastatin (ZOCOR) 40 MG tablet, Take 20 mg by mouth daily. , Disp: , Rfl:    Allergies  Allergen Reactions   Penicillins Rash    Has patient had a PCN reaction causing immediate rash, facial/tongue/throat swelling, SOB or lightheadedness with hypotension: Yes Has patient had a PCN reaction causing severe rash involving mucus membranes or skin necrosis: No Has patient had a PCN reaction that required hospitalization: Yes Has patient had a PCN reaction occurring within the last 10 years: No If all of the above answers are "NO", then may proceed with Cephalosporin use.     Past Medical History:  Diagnosis Date   BNC (bladder neck contracture)    BPH (benign prostatic hypertrophy)    DDD (degenerative disc disease), lumbar    Frequency of urination    Hyperlipemia    Hypertension    Nocturia    PONV (postoperative nausea and vomiting) 1973   after ORIF rt arm   Spinal stenosis    Urgency of urination      Past  Surgical History:  Procedure Laterality Date   BACK SURGERY     CATARACT EXTRACTION W/ INTRAOCULAR LENS  IMPLANT, BILATERAL     EYE SURGERY     HEMIARTHROPLASTY SHOULDER FRACTURE  1996   RIGHT--  TRAMATIC INJURY/ AVASCULAR NECROSIS   JOINT REPLACEMENT     KNEE ARTHROSCOPY  2002   LEFT   LUMBAR FUSION  05-08-2009   L5 - S1   ORIF ARM FX  1973   PARTIAL KNEE ARTHROPLASTY Right 03/30/2017   Procedure: Right knee medial unicompartmental arthroplasty;  Surgeon: Ollen Gross, MD;  Location: [facility];  Service: Orthopedics;  Laterality: Right;   TONSILLECTOMY  1942   TOTAL KNEE ARTHROPLASTY  09-04-2001   LEFT   TRANSURETHRAL RESECTION OF PROSTATE  26 YRS AGO   AND REMOVAL OF PROSTATIC STONE   TRANSURETHRAL RESECTION OF PROSTATE  09/20/2011   Procedure: TRANSURETHRAL RESECTION OF THE PROSTATE WITH GYRUS INSTRUMENTS;  Surgeon: Milford Cage, MD;  Location[facility];  Service: Urology;  Laterality: N/A;    History reviewed. No pertinent family history.  Social History   Tobacco Use   Smoking status: Former    Current packs/day: 0.00    Types: Cigarettes    Start date: 07/23/1949    Quit date: 07/23/1964    Years since quitting: 58.9   Smokeless tobacco: Never  Substance Use Topics   Alcohol use: No   Drug use: No  ROS   Objective:   Vitals: BP (!) 177/69 (BP Location: Right Arm)   Pulse 74   Temp 98.1 F (36.7 C) (Oral)   Resp 15   Ht 5\' 7"  (1.702 m)   Wt 170 lb (77.1 kg)   SpO2 92%   BMI 26.63 kg/m   Physical Exam Constitutional:      General: He is not in acute distress.    Appearance: Normal appearance. He is well-developed. He is not ill-appearing, toxic-appearing or diaphoretic.  HENT:     Head: Normocephalic and atraumatic.     Right Ear: External ear normal.     Left Ear: External ear normal.     Nose: Nose normal.     Mouth/Throat:     Mouth: Mucous membranes are moist.  Eyes:     General: No scleral icterus.       Right  eye: No discharge.        Left eye: No discharge.     Extraocular Movements: Extraocular movements intact.  Cardiovascular:     Rate and Rhythm: Normal rate and regular rhythm.     Heart sounds: Normal heart sounds. No murmur heard.    No friction rub. No gallop.  Pulmonary:     Effort: Pulmonary effort is normal. No respiratory distress.     Breath sounds: No stridor. Rhonchi and rales present. No wheezing.  Chest:     Chest wall: No tenderness.  Neurological:     Mental Status: He is alert and oriented to person, place, and time.  Psychiatric:        Mood and Affect: Mood normal.        Behavior: Behavior normal.        Thought Content: Thought content normal.    DG Chest 2 View Result Date: 07/04/2023 CLINICAL DATA:  Cough. EXAM: CHEST - 2 VIEW COMPARISON:  Chest x-ray dated Nov 03, 2020. FINDINGS: The heart size and mediastinal contours are within normal limits. Normal pulmonary vascularity. Asymmetric reticulonodular interstitial opacity in the right upper lobe. Streaky linear opacity in the right middle lobe, likely atelectasis. No pleural effusion or pneumothorax. No acute osseous abnormality. IMPRESSION: 1. Asymmetric reticulonodular interstitial opacity in the right upper lobe, likely infectious or inflammatory. Electronically Signed   By: Obie Dredge M.D.   On: 07/04/2023 12:17   Assessment and Plan :   PDMP not reviewed this encounter.  1. Acute bacterial bronchitis   2. Chest congestion    Will cover for bacterial bronchitis with azithromycin, cough syrup, general supportive care.  Counseled patient on potential for adverse effects with medications prescribed/recommended today, ER and return-to-clinic precautions discussed, patient verbalized understanding.    Wallis Bamberg, PA-C 07/04/23 1320

## 2023-07-04 NOTE — ED Triage Notes (Signed)
Patient presents with cough, sneezing and had a headache last night. No treatment used.

## 2023-08-26 ENCOUNTER — Ambulatory Visit
Admission: EM | Admit: 2023-08-26 | Discharge: 2023-08-26 | Disposition: A | Discharge status: Home / Self Care | Payer: Medicare Other | Attending: Physician Assistant | Admitting: Physician Assistant

## 2023-08-26 DIAGNOSIS — R220 Localized swelling, mass and lump, head: Secondary | ICD-10-CM | POA: Diagnosis not present

## 2023-08-26 MED ORDER — AZELASTINE HCL 0.05 % OP SOLN: 1.0000 [drp] | Freq: Two times a day (BID) | OPHTHALMIC | 12 refills | Status: AC

## 2023-08-26 NOTE — ED Triage Notes (Signed)
 Pt presents to UC for c/o bilateral eyelid swelling since yesterday morning. Pt reports some swelling after a dermatologic procedure 2 days ago. Pt has used ice on the face to reduce swelling.

## 2023-08-26 NOTE — ED Provider Notes (Signed)
 [facility]    CSN: [id number] Arrival date & time: 08/26/23  1022      History   Chief Complaint No chief complaint on file.   HPI Dennis Griffin is a 89 y.o. male.   Patient here c/w b/l eye swelling x yesterday morning.  He had dermatological procedure for skin cancer on scalp 2 days ago.  Otherwise, no new foods, soaps, detergents, changes in medications.  He denies pain, tenderness, itchiness, red eye, vision changes, vision loss. He denies prior history of allergies.      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   DIVERTICULOSIS, 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    Past Surgical History:  Procedure Laterality Date   BACK SURGERY     CATARACT EXTRACTION W/ INTRAOCULAR LENS  IMPLANT, BILATERAL     EYE SURGERY     HEMIARTHROPLASTY SHOULDER FRACTURE  1996   RIGHT--  TRAMATIC INJURY/ AVASCULAR NECROSIS   JOINT REPLACEMENT     KNEE ARTHROSCOPY  2002   LEFT   LUMBAR FUSION  05-08-2009   L5 - S1   ORIF ARM FX  1973   PARTIAL KNEE ARTHROPLASTY Right 03/30/2017   Procedure: Right knee medial unicompartmental arthroplasty;  Surgeon: Ollen Gross, MD;  Location: [facility];  Service: Orthopedics;  Laterality: Right;   TONSILLECTOMY  1942   TOTAL KNEE ARTHROPLASTY  09-04-2001   LEFT   TRANSURETHRAL RESECTION OF PROSTATE  26 YRS AGO   AND REMOVAL OF PROSTATIC STONE   TRANSURETHRAL RESECTION OF PROSTATE  09/20/2011   Procedure: TRANSURETHRAL RESECTION OF THE PROSTATE WITH GYRUS INSTRUMENTS;  Surgeon: Milford Cage, MD;  Location: [facility];  Service: Urology;  Laterality: N/A;       Home Medications    Prior to Admission medications   Medication Sig Start Date End Date Taking? Authorizing Provider  azelastine (OPTIVAR) 0.05 % ophthalmic solution Place 1 drop into both eyes 2 (two) times daily. 08/26/23  Yes Evern Core, PA-C  doxazosin (CARDURA) 8 MG tablet Take 4 mg by mouth daily.  01/14/11  Yes [provider]  gabapentin (NEURONTIN) 600 MG tablet Take 300-600 mg by mouth daily as needed (depends on pain if takes 300-600 mg).  12/31/14  Yes [provider]  simvastatin (ZOCOR) 40 MG tablet Take 20 mg by mouth daily.    Yes [provider]  azithromycin (ZITHROMAX) 250 MG tablet Day 1: take 2 tablets. Day 2-5: Take 1 tablet daily. 07/04/23   Wallis Bamberg, PA-C  promethazine-dextromethorphan (PROMETHAZINE-DM) 6.25-15 MG/5ML syrup Take 2.5 mLs by mouth 3 (three) times daily as needed for cough. 07/04/23   Wallis Bamberg, PA-C    Family History History reviewed. No pertinent family history.  Social History Social History   Tobacco Use   Smoking status: Former    Current packs/day: 0.00    Types: Cigarettes    Start date: 07/23/1949    Quit date: 07/23/1964    Years since quitting: 90.1  Smokeless tobacco: Never  Substance Use Topics   Alcohol use: No   Drug use: No     Allergies   Penicillins   Review of Systems Review of Systems  Constitutional:  Negative for chills, fatigue and fever.  HENT:  Positive for facial swelling. Negative for congestion, ear pain, nosebleeds, postnasal drip, rhinorrhea, sinus pressure, sinus pain and sore throat.   Eyes:  Negative for pain and redness.  Respiratory:  Negative for cough, shortness of breath and wheezing.   Gastrointestinal:  Negative for abdominal pain, diarrhea, nausea and vomiting.  Musculoskeletal:  Negative for arthralgias and myalgias.  Skin:  Negative for rash.  Neurological:  Negative for light-headedness and  headaches.  Hematological:  Negative for adenopathy. Does not bruise/bleed easily.  Psychiatric/Behavioral:  Negative for confusion and sleep disturbance.      Physical Exam Triage Vital Signs ED Triage Vitals  Encounter Vitals Group     BP 08/26/23 1101 (!) 168/59     Systolic BP Percentile --      Diastolic BP Percentile --      Pulse Rate 08/26/23 1101 (!) 54     Resp 08/26/23 1101 16     Temp 08/26/23 1101 97.9 F (36.6 C)     Temp Source 08/26/23 1101 Oral     SpO2 08/26/23 1101 95 %     Weight --      Height --      Head Circumference --      Peak Flow --      Pain Score 08/26/23 1058 0     Pain Loc --      Pain Education --      Exclude from Growth Chart --    No data found.  Updated Vital Signs BP (!) 168/59 (BP Location: Right Arm)   Pulse (!) 54   Temp 97.9 F (36.6 C) (Oral)   Resp 16   SpO2 95%   Visual Acuity Right Eye Distance:   Left Eye Distance:   Bilateral Distance:    Right Eye Near:   Left Eye Near:    Bilateral Near:     Physical Exam Vitals and nursing note reviewed.  Constitutional:      General: He is not in acute distress.    Appearance: Normal appearance. He is not ill-appearing.  HENT:     Head: Normocephalic and atraumatic.     Comments: B/l periorbital swelling, no erythema, no tenderness, no masses or lesions    Nose: No congestion or rhinorrhea.     Mouth/Throat:     Pharynx: No oropharyngeal exudate or posterior oropharyngeal erythema.  Eyes:     General: No scleral icterus.       Right eye: No discharge or hordeolum.        Left eye: No discharge or hordeolum.     Extraocular Movements: Extraocular movements intact.     Right eye: Normal extraocular motion.     Left eye: Normal extraocular motion.     Conjunctiva/sclera: Conjunctivae normal.     Right eye: Right conjunctiva is not injected. No exudate.    Left eye: Left conjunctiva is not injected. No exudate.    Pupils: Pupils are equal, round, and reactive to  light.     Slit lamp exam:    Right eye: No photophobia.     Left eye: No photophobia.  Cardiovascular:     Rate and Rhythm: Normal rate and regular rhythm.     Heart sounds: No  murmur heard. Pulmonary:     Effort: Pulmonary effort is normal. No respiratory distress.     Breath sounds: Normal breath sounds. No wheezing or rales.  Musculoskeletal:     Cervical back: Normal range of motion. No rigidity.  Lymphadenopathy:     Cervical: No cervical adenopathy.  Skin:    Coloration: Skin is not jaundiced.     Findings: No rash.  Neurological:     General: No focal deficit present.     Mental Status: He is alert and oriented to person, place, and time.     Motor: No weakness.     Gait: Gait normal.  Psychiatric:        Mood and Affect: Mood normal.        Behavior: Behavior normal.      UC Treatments / Results  Labs (all labs ordered are listed, but only abnormal results are displayed) Labs Reviewed - No data to display  EKG   Radiology No results found.  Procedures Procedures (including critical care time)  Medications Ordered in UC Medications - No data to display  Initial Impression / Assessment and Plan / UC Course  I have reviewed the triage vital signs and the nursing notes.  Pertinent labs & imaging results that were available during my care of the patient were reviewed by me and considered in my medical decision making (see chart for details).     Likely allergy, possibly from procedure?  Unable to be certain cause No signs of infection Final Clinical Impressions(s) / UC Diagnoses   Final diagnoses:  Facial swelling     Discharge Instructions      Use hydrocortisone 1% around eyes twice daily Take zyrtec 10 mg once daily Take pepcid 20 mg once daily    ED Prescriptions     Medication Sig Dispense Auth. Provider   azelastine (OPTIVAR) 0.05 % ophthalmic solution Place 1 drop into both eyes 2 (two) times daily. 6 mL Evern Core, PA-C       PDMP not reviewed this encounter.   Evern Core, PA-C 08/26/23 1154

## 2023-08-26 NOTE — Discharge Instructions (Signed)
 Use hydrocortisone 1% around eyes twice daily Take zyrtec 10 mg once daily Take pepcid 20 mg once daily

## 2024-05-24 ENCOUNTER — Other Ambulatory Visit: Payer: Self-pay

## 2024-05-24 ENCOUNTER — Ambulatory Visit
Admission: EM | Admit: 2024-05-24 | Discharge: 2024-05-24 | Disposition: A | Discharge status: Home / Self Care | Attending: Nurse Practitioner | Admitting: Nurse Practitioner

## 2024-05-24 DIAGNOSIS — J209 Acute bronchitis, unspecified: Secondary | ICD-10-CM | POA: Diagnosis not present

## 2024-05-24 MED ORDER — AZITHROMYCIN 250 MG PO TABS: 250.0000 mg | ORAL_TABLET | Freq: Every day | ORAL | 0 refills | Status: DC

## 2024-05-24 NOTE — ED Provider Notes (Signed)
 [facility]    CSN: [id number] Arrival date & time: 05/24/24  0806      History   Chief Complaint Chief Complaint  Patient presents with   Cough    HPI Dennis Griffin is a 89 y.o. male  presents for evaluation of URI symptoms for 6 days. Patient reports associated symptoms of productive cough with congestion and malaise. Denies N/V/D, fevers, sore throat, ear pain, body aches, shortness of breath. Patient does not have a hx of asthma. Patient is not an active smoker.   Reports no known sick contacts.  Pt has taken cough medicine OTC for symptoms. Pt has no other concerns at this time.    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   DIVERTICULOSIS, 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    Past Surgical History:  Procedure Laterality Date   BACK SURGERY     CATARACT EXTRACTION W/ INTRAOCULAR LENS  IMPLANT, BILATERAL     EYE SURGERY     HEMIARTHROPLASTY SHOULDER FRACTURE  1996   RIGHT--  TRAMATIC INJURY/ AVASCULAR NECROSIS   JOINT REPLACEMENT     KNEE ARTHROSCOPY  2002   LEFT   LUMBAR FUSION  05-08-2009   L5 - S1   ORIF ARM FX  1973   PARTIAL KNEE ARTHROPLASTY Right 03/30/2017   Procedure: Right knee medial unicompartmental arthroplasty;  Surgeon: Melodi Lerner, MD;  Location: [facility];  Service: Orthopedics;  Laterality: Right;   TONSILLECTOMY  1942   TOTAL KNEE ARTHROPLASTY  09-04-2001   LEFT   TRANSURETHRAL RESECTION OF PROSTATE  26 YRS AGO   AND REMOVAL OF PROSTATIC STONE   TRANSURETHRAL RESECTION OF PROSTATE  09/20/2011   Procedure:  TRANSURETHRAL RESECTION OF THE PROSTATE WITH GYRUS INSTRUMENTS;  Surgeon: Toribio Neysa Repine, MD;  Location: [facility];  Service: Urology;  Laterality: N/A;       Home Medications    Prior to Admission medications   Medication Sig Start Date End Date Taking? Authorizing Provider  azithromycin  (ZITHROMAX ) 250 MG tablet Take 1 tablet (250 mg total) by mouth daily. Take first 2 tablets together, then 1 every day until finished. 05/24/24  Yes Darrell Leonhardt, Jodi R, NP  azelastine  (OPTIVAR ) 0.05 % ophthalmic solution Place 1 drop into both eyes 2 (two) times daily. 08/26/23   Juleen Rush, PA-C  doxazosin  (CARDURA ) 8 MG tablet Take 4 mg by mouth daily.  01/14/11   [provider]  gabapentin  (NEURONTIN ) 600 MG tablet Take 300-600 mg by mouth daily as needed (depends on pain if takes 300-600 mg).  12/31/14   [provider]  simvastatin  (ZOCOR ) 40 MG tablet Take 20 mg by mouth daily.     [provider]    Family History History reviewed. No pertinent family history.  Social History Social History   Tobacco Use   Smoking status: Former    Current packs/day: 0.00    Types: Cigarettes    Start date: 07/23/1949    Quit  date: 07/23/1964    Years since quitting: 59.8   Smokeless tobacco: Never  Vaping Use   Vaping status: Never Used  Substance Use Topics   Alcohol use: No   Drug use: No     Allergies   Penicillins   Review of Systems Review of Systems  Constitutional:        Malaise  HENT:  Positive for congestion.   Respiratory:  Positive for cough.      Physical Exam Triage Vital Signs ED Triage Vitals  Encounter Vitals Group     BP 05/24/24 0828 (!) 141/70     Girls Systolic BP Percentile --      Girls Diastolic BP Percentile --      Boys Systolic BP Percentile --      Boys Diastolic BP Percentile --      Pulse Rate 05/24/24 0828 77     Resp 05/24/24 0828 17     Temp 05/24/24 0828 99.6 F (37.6 C)     Temp Source 05/24/24  0828 Oral     SpO2 05/24/24 0828 92 %     Weight --      Height --      Head Circumference --      Peak Flow --      Pain Score 05/24/24 0827 0     Pain Loc --      Pain Education --      Exclude from Growth Chart --    No data found.  Updated Vital Signs BP (!) 141/70   Pulse 77   Temp 99.6 F (37.6 C) (Oral)   Resp 17   SpO2 92%   Visual Acuity Right Eye Distance:   Left Eye Distance:   Bilateral Distance:    Right Eye Near:   Left Eye Near:    Bilateral Near:     Physical Exam Vitals and nursing note reviewed.  Constitutional:      General: He is not in acute distress.    Appearance: Normal appearance. He is not ill-appearing or toxic-appearing.  HENT:     Head: Normocephalic and atraumatic.     Right Ear: Tympanic membrane and ear canal normal.     Left Ear: Tympanic membrane and ear canal normal.     Nose: Congestion present.     Mouth/Throat:     Mouth: Mucous membranes are moist.     Pharynx: No posterior oropharyngeal erythema.  Eyes:     Pupils: Pupils are equal, round, and reactive to light.  Cardiovascular:     Rate and Rhythm: Normal rate and regular rhythm.     Heart sounds: Normal heart sounds.  Pulmonary:     Effort: Pulmonary effort is normal.     Breath sounds: Normal breath sounds. No wheezing or rhonchi.  Musculoskeletal:     Cervical back: Normal range of motion and neck supple.  Lymphadenopathy:     Cervical: No cervical adenopathy.  Skin:    General: Skin is warm and dry.  Neurological:     General: No focal deficit present.     Mental Status: He is alert and oriented to person, place, and time.  Psychiatric:        Mood and Affect: Mood normal.        Behavior: Behavior normal.      UC Treatments / Results  Labs (all labs ordered are listed, but only abnormal results are displayed) Labs Reviewed - No data to display  EKG  Radiology No results found.  Procedures Procedures (including critical care  time)  Medications Ordered in UC Medications - No data to display  Initial Impression / Assessment and Plan / UC Course  I have reviewed the triage vital signs and the nursing notes.  Pertinent labs & imaging results that were available during my care of the patient were reviewed by me and considered in my medical decision making (see chart for details).     Reviewed exam and symptoms with patient.  No x-ray available onsite and patient declined going to one of our other facilities for chest x-ray.  Will treat bronchitis with azithromycin .  Encouraged rest, fluids and follow-up with PCP in 2 days for recheck.  ER precautions reviewed and patient verbalized understanding. Final Clinical Impressions(s) / UC Diagnoses   Final diagnoses:  Acute bronchitis, unspecified organism     Discharge Instructions      Start azithromycin  antibiotic as prescribed.  You may continue over-the-counter cough medicine as needed.  Lots of rest and fluids.  Please follow-up with your PCP in 2 days for recheck.  Please go to the ER if you develop any worsening symptoms.  Hope you feel better soon!    ED Prescriptions     Medication Sig Dispense Auth. Provider   azithromycin  (ZITHROMAX ) 250 MG tablet Take 1 tablet (250 mg total) by mouth daily. Take first 2 tablets together, then 1 every day until finished. 6 tablet Jhada Risk, Jodi R, NP      PDMP not reviewed this encounter.   Loreda Myla SAUNDERS, NP 05/24/24 [phone]

## 2024-05-24 NOTE — Discharge Instructions (Addendum)
 Start azithromycin  antibiotic as prescribed.  You may continue over-the-counter cough medicine as needed.  Lots of rest and fluids.  Please follow-up with your PCP in 2 days for recheck.  Please go to the ER if you develop any worsening symptoms.  Hope you feel better soon!

## 2024-05-24 NOTE — ED Triage Notes (Signed)
 Pt c/o productive cough w/brown mucous, head congestion, chest congestionx5d.

## 2024-07-03 ENCOUNTER — Emergency Department (EMERGENCY_DEPARTMENT_HOSPITAL)

## 2024-07-03 ENCOUNTER — Emergency Department (HOSPITAL_COMMUNITY)

## 2024-07-03 ENCOUNTER — Ambulatory Visit
Admission: EM | Admit: 2024-07-03 | Discharge: 2024-07-03 | Disposition: A | Discharge status: Home / Self Care | Attending: Family Medicine | Admitting: Family Medicine

## 2024-07-03 ENCOUNTER — Encounter (HOSPITAL_COMMUNITY): Payer: Self-pay

## 2024-07-03 ENCOUNTER — Observation Stay (HOSPITAL_COMMUNITY)
Admission: EM | Admit: 2024-07-03 | Discharge: 2024-07-04 | Disposition: A | Discharge status: Home / Self Care | Attending: Internal Medicine | Admitting: Internal Medicine

## 2024-07-03 DIAGNOSIS — R9431 Abnormal electrocardiogram [ECG] [EKG]: Secondary | ICD-10-CM | POA: Diagnosis not present

## 2024-07-03 DIAGNOSIS — R55 Syncope and collapse: Principal | ICD-10-CM | POA: Diagnosis present

## 2024-07-03 DIAGNOSIS — W938XXA Exposure to other excessive cold of man-made origin, initial encounter: Secondary | ICD-10-CM | POA: Diagnosis not present

## 2024-07-03 DIAGNOSIS — W19XXXA Unspecified fall, initial encounter: Secondary | ICD-10-CM | POA: Diagnosis not present

## 2024-07-03 DIAGNOSIS — R42 Dizziness and giddiness: Secondary | ICD-10-CM | POA: Diagnosis present

## 2024-07-03 DIAGNOSIS — R918 Other nonspecific abnormal finding of lung field: Secondary | ICD-10-CM | POA: Diagnosis not present

## 2024-07-03 DIAGNOSIS — R6510 Systemic inflammatory response syndrome (SIRS) of non-infectious origin without acute organ dysfunction: Secondary | ICD-10-CM

## 2024-07-03 DIAGNOSIS — D649 Anemia, unspecified: Secondary | ICD-10-CM

## 2024-07-03 DIAGNOSIS — E785 Hyperlipidemia, unspecified: Secondary | ICD-10-CM | POA: Diagnosis present

## 2024-07-03 DIAGNOSIS — Y92018 Other place in single-family (private) house as the place of occurrence of the external cause: Secondary | ICD-10-CM | POA: Insufficient documentation

## 2024-07-03 DIAGNOSIS — K869 Disease of pancreas, unspecified: Secondary | ICD-10-CM

## 2024-07-03 DIAGNOSIS — T68XXXA Hypothermia, initial encounter: Secondary | ICD-10-CM | POA: Diagnosis not present

## 2024-07-03 DIAGNOSIS — I129 Hypertensive chronic kidney disease with stage 1 through stage 4 chronic kidney disease, or unspecified chronic kidney disease: Secondary | ICD-10-CM | POA: Insufficient documentation

## 2024-07-03 DIAGNOSIS — R001 Bradycardia, unspecified: Secondary | ICD-10-CM

## 2024-07-03 DIAGNOSIS — F10229 Alcohol dependence with intoxication, unspecified: Secondary | ICD-10-CM | POA: Insufficient documentation

## 2024-07-03 DIAGNOSIS — R4182 Altered mental status, unspecified: Secondary | ICD-10-CM | POA: Diagnosis present

## 2024-07-03 DIAGNOSIS — D61818 Other pancytopenia: Secondary | ICD-10-CM | POA: Diagnosis not present

## 2024-07-03 DIAGNOSIS — R296 Repeated falls: Secondary | ICD-10-CM | POA: Diagnosis not present

## 2024-07-03 DIAGNOSIS — K862 Cyst of pancreas: Secondary | ICD-10-CM | POA: Diagnosis not present

## 2024-07-03 DIAGNOSIS — I3139 Other pericardial effusion (noninflammatory): Secondary | ICD-10-CM | POA: Insufficient documentation

## 2024-07-03 DIAGNOSIS — Z87891 Personal history of nicotine dependence: Secondary | ICD-10-CM | POA: Insufficient documentation

## 2024-07-03 DIAGNOSIS — I6521 Occlusion and stenosis of right carotid artery: Secondary | ICD-10-CM

## 2024-07-03 DIAGNOSIS — R7989 Other specified abnormal findings of blood chemistry: Secondary | ICD-10-CM

## 2024-07-03 DIAGNOSIS — R3129 Other microscopic hematuria: Secondary | ICD-10-CM | POA: Insufficient documentation

## 2024-07-03 DIAGNOSIS — Y906 Blood alcohol level of 120-199 mg/100 ml: Secondary | ICD-10-CM | POA: Diagnosis not present

## 2024-07-03 DIAGNOSIS — N1832 Chronic kidney disease, stage 3b: Secondary | ICD-10-CM | POA: Diagnosis present

## 2024-07-03 DIAGNOSIS — Z96653 Presence of artificial knee joint, bilateral: Secondary | ICD-10-CM | POA: Diagnosis not present

## 2024-07-03 DIAGNOSIS — F10920 Alcohol use, unspecified with intoxication, uncomplicated: Secondary | ICD-10-CM

## 2024-07-03 DIAGNOSIS — R319 Hematuria, unspecified: Secondary | ICD-10-CM

## 2024-07-03 DIAGNOSIS — I10 Essential (primary) hypertension: Secondary | ICD-10-CM | POA: Diagnosis present

## 2024-07-03 DIAGNOSIS — F10129 Alcohol abuse with intoxication, unspecified: Secondary | ICD-10-CM | POA: Insufficient documentation

## 2024-07-03 DIAGNOSIS — I1310 Hypertensive heart and chronic kidney disease without heart failure, with stage 1 through stage 4 chronic kidney disease, or unspecified chronic kidney disease: Secondary | ICD-10-CM | POA: Diagnosis not present

## 2024-07-03 DIAGNOSIS — F10929 Alcohol use, unspecified with intoxication, unspecified: Secondary | ICD-10-CM

## 2024-07-03 DIAGNOSIS — Z79899 Other long term (current) drug therapy: Secondary | ICD-10-CM | POA: Insufficient documentation

## 2024-07-03 DIAGNOSIS — R935 Abnormal findings on diagnostic imaging of other abdominal regions, including retroperitoneum: Secondary | ICD-10-CM | POA: Insufficient documentation

## 2024-07-03 LAB — URINALYSIS, W/ REFLEX TO CULTURE (INFECTION SUSPECTED)
Bacteria, UA: NONE SEEN
Bilirubin Urine: NEGATIVE
Glucose, UA: NEGATIVE mg/dL
Ketones, ur: NEGATIVE mg/dL
Leukocytes,Ua: NEGATIVE
Nitrite: NEGATIVE
Protein, ur: NEGATIVE mg/dL
Specific Gravity, Urine: 1.011 (ref 1.005–1.030)
pH: 6 (ref 5.0–8.0)

## 2024-07-03 LAB — COMPREHENSIVE METABOLIC PANEL WITH GFR
ALT: 11 U/L (ref 0–44)
AST: 23 U/L (ref 15–41)
Albumin: 4.3 g/dL (ref 3.5–5.0)
Alkaline Phosphatase: 76 U/L (ref 38–126)
Anion gap: 12 (ref 5–15)
BUN: 17 mg/dL (ref 8–23)
CO2: 22 mmol/L (ref 22–32)
Calcium: 9.4 mg/dL (ref 8.9–10.3)
Chloride: 104 mmol/L (ref 98–111)
Creatinine, Ser: 1.58 mg/dL — ABNORMAL HIGH (ref 0.61–1.24)
GFR, Estimated: 41 mL/min — ABNORMAL LOW
Glucose, Bld: 113 mg/dL — ABNORMAL HIGH (ref 70–99)
Potassium: 4 mmol/L (ref 3.5–5.1)
Sodium: 138 mmol/L (ref 135–145)
Total Bilirubin: 0.4 mg/dL (ref 0.0–1.2)
Total Protein: 6.7 g/dL (ref 6.5–8.1)

## 2024-07-03 LAB — I-STAT CHEM 8, ED
BUN: 17 mg/dL (ref 8–23)
Calcium, Ion: 1.16 mmol/L (ref 1.15–1.40)
Chloride: 106 mmol/L (ref 98–111)
Creatinine, Ser: 1.7 mg/dL — ABNORMAL HIGH (ref 0.61–1.24)
Glucose, Bld: 104 mg/dL — ABNORMAL HIGH (ref 70–99)
HCT: 29 % — ABNORMAL LOW (ref 39.0–52.0)
Hemoglobin: 9.9 g/dL — ABNORMAL LOW (ref 13.0–17.0)
Potassium: 3.9 mmol/L (ref 3.5–5.1)
Sodium: 139 mmol/L (ref 135–145)
TCO2: 21 mmol/L — ABNORMAL LOW (ref 22–32)

## 2024-07-03 LAB — APTT: aPTT: 29 s (ref 24–36)

## 2024-07-03 LAB — IRON AND TIBC
Iron: 65 ug/dL (ref 45–182)
Saturation Ratios: 22 % (ref 17.9–39.5)
TIBC: 295 ug/dL (ref 250–450)
UIBC: 231 ug/dL

## 2024-07-03 LAB — URINE DRUG SCREEN
Amphetamines: NEGATIVE
Barbiturates: NEGATIVE
Benzodiazepines: NEGATIVE
Cocaine: NEGATIVE
Fentanyl: NEGATIVE
Methadone Scn, Ur: NEGATIVE
Opiates: NEGATIVE
Tetrahydrocannabinol: NEGATIVE

## 2024-07-03 LAB — PROTIME-INR
INR: 1 (ref 0.8–1.2)
Prothrombin Time: 13.6 s (ref 11.4–15.2)

## 2024-07-03 LAB — CBG MONITORING, ED: Glucose-Capillary: 97 mg/dL (ref 70–99)

## 2024-07-03 LAB — I-STAT CG4 LACTIC ACID, ED: Lactic Acid, Venous: 2 mmol/L (ref 0.5–1.9)

## 2024-07-03 LAB — CBC WITH DIFFERENTIAL/PLATELET
Abs Immature Granulocytes: 0.01 K/uL (ref 0.00–0.07)
Basophils Absolute: 0 K/uL (ref 0.0–0.1)
Basophils Relative: 1 %
Eosinophils Absolute: 0.1 K/uL (ref 0.0–0.5)
Eosinophils Relative: 2 %
HCT: 28.9 % — ABNORMAL LOW (ref 39.0–52.0)
Hemoglobin: 9.3 g/dL — ABNORMAL LOW (ref 13.0–17.0)
Immature Granulocytes: 0 %
Lymphocytes Relative: 28 %
Lymphs Abs: 1.1 K/uL (ref 0.7–4.0)
MCH: 29.7 pg (ref 26.0–34.0)
MCHC: 32.2 g/dL (ref 30.0–36.0)
MCV: 92.3 fL (ref 80.0–100.0)
Monocytes Absolute: 0.3 K/uL (ref 0.1–1.0)
Monocytes Relative: 7 %
Neutro Abs: 2.4 K/uL (ref 1.7–7.7)
Neutrophils Relative %: 62 %
Platelets: 116 K/uL — ABNORMAL LOW (ref 150–400)
RBC: 3.13 MIL/uL — ABNORMAL LOW (ref 4.22–5.81)
RDW: 14.5 % (ref 11.5–15.5)
WBC: 3.8 K/uL — ABNORMAL LOW (ref 4.0–10.5)
nRBC: 0 % (ref 0.0–0.2)

## 2024-07-03 LAB — LACTIC ACID, PLASMA: Lactic Acid, Venous: 1.8 mmol/L (ref 0.5–1.9)

## 2024-07-03 LAB — ETHANOL
Alcohol, Ethyl (B): 127 mg/dL — ABNORMAL HIGH
Alcohol, Ethyl (B): 84 mg/dL — ABNORMAL HIGH

## 2024-07-03 LAB — FERRITIN: Ferritin: 134 ng/mL (ref 24–336)

## 2024-07-03 LAB — TYPE AND SCREEN
ABO/RH(D): O POS
Antibody Screen: NEGATIVE

## 2024-07-03 LAB — VITAMIN B12: Vitamin B-12: 324 pg/mL (ref 180–914)

## 2024-07-03 LAB — CK: Total CK: 148 U/L (ref 49–397)

## 2024-07-03 LAB — MAGNESIUM: Magnesium: 2.3 mg/dL (ref 1.7–2.4)

## 2024-07-03 LAB — GLUCOSE, POCT (MANUAL RESULT ENTRY): POCT Glucose (KUC): 138 mg/dL — AB (ref 70–99)

## 2024-07-03 LAB — TROPONIN T, HIGH SENSITIVITY: Troponin T High Sensitivity: 25 ng/L — ABNORMAL HIGH (ref 0–19)

## 2024-07-03 MED ORDER — DOXAZOSIN MESYLATE 4 MG PO TABS
4.0000 mg | ORAL_TABLET | Freq: Every day | ORAL | Status: DC
  Administered 2024-07-04: 4 mg via ORAL
  Filled 2024-07-03: qty 1

## 2024-07-03 MED ORDER — THIAMINE MONONITRATE 100 MG PO TABS
100.0000 mg | ORAL_TABLET | Freq: Every day | ORAL | Status: DC
  Administered 2024-07-03 – 2024-07-04 (×2): 100 mg via ORAL
  Filled 2024-07-03 (×2): qty 1

## 2024-07-03 MED ORDER — SIMVASTATIN 20 MG PO TABS
20.0000 mg | ORAL_TABLET | Freq: Every day | ORAL | Status: DC
  Administered 2024-07-04: 20 mg via ORAL
  Filled 2024-07-03: qty 1

## 2024-07-03 MED ORDER — GABAPENTIN 300 MG PO CAPS: 300.0000 mg | ORAL_CAPSULE | Freq: Every day | ORAL | Status: DC | PRN

## 2024-07-03 MED ORDER — VANCOMYCIN HCL 1500 MG/300ML IV SOLN
1500.0000 mg | Freq: Once | INTRAVENOUS | Status: AC
  Administered 2024-07-03: 1500 mg via INTRAVENOUS
  Filled 2024-07-03: qty 300

## 2024-07-03 MED ORDER — ACETAMINOPHEN 325 MG PO TABS: 650.0000 mg | ORAL_TABLET | Freq: Four times a day (QID) | ORAL | Status: DC | PRN

## 2024-07-03 MED ORDER — ONDANSETRON HCL 4 MG/2ML IJ SOLN: 4.0000 mg | Freq: Four times a day (QID) | INTRAMUSCULAR | Status: DC | PRN

## 2024-07-03 MED ORDER — PIPERACILLIN-TAZOBACTAM 3.375 G IVPB 30 MIN
3.3750 g | Freq: Once | INTRAVENOUS | Status: AC
  Administered 2024-07-03: 3.375 g via INTRAVENOUS
  Filled 2024-07-03: qty 50

## 2024-07-03 MED ORDER — ACETAMINOPHEN 650 MG RE SUPP: 650.0000 mg | Freq: Four times a day (QID) | RECTAL | Status: DC | PRN

## 2024-07-03 MED ORDER — KETOTIFEN FUMARATE 0.035 % OP SOLN: 1.0000 [drp] | Freq: Two times a day (BID) | OPHTHALMIC | Status: DC | PRN

## 2024-07-03 MED ORDER — ONDANSETRON HCL 4 MG PO TABS: 4.0000 mg | ORAL_TABLET | Freq: Four times a day (QID) | ORAL | Status: DC | PRN

## 2024-07-03 MED ORDER — FOLIC ACID 1 MG PO TABS
1.0000 mg | ORAL_TABLET | Freq: Every day | ORAL | Status: DC
  Administered 2024-07-03 – 2024-07-04 (×2): 1 mg via ORAL
  Filled 2024-07-03 (×2): qty 1

## 2024-07-03 MED ORDER — ADULT MULTIVITAMIN W/MINERALS CH
1.0000 | ORAL_TABLET | Freq: Every day | ORAL | Status: DC
  Administered 2024-07-03 – 2024-07-04 (×2): 1 via ORAL
  Filled 2024-07-03 (×2): qty 1

## 2024-07-03 MED ORDER — HYDROCODONE-ACETAMINOPHEN 5-325 MG PO TABS
1.0000 | ORAL_TABLET | ORAL | Status: DC | PRN
  Administered 2024-07-04: 1 via ORAL
  Filled 2024-07-03: qty 1

## 2024-07-03 MED ORDER — LORAZEPAM 2 MG/ML IJ SOLN: 1.0000 mg | INTRAMUSCULAR | Status: DC | PRN

## 2024-07-03 MED ORDER — THIAMINE HCL 100 MG/ML IJ SOLN: 100.0000 mg | Freq: Every day | INTRAMUSCULAR | Status: DC

## 2024-07-03 MED ORDER — LORAZEPAM 1 MG PO TABS: 1.0000 mg | ORAL_TABLET | ORAL | Status: DC | PRN

## 2024-07-03 MED ORDER — IOHEXOL 350 MG/ML SOLN
75.0000 mL | Freq: Once | INTRAVENOUS | Status: AC | PRN
  Administered 2024-07-03: 75 mL via INTRAVENOUS

## 2024-07-03 MED ORDER — SODIUM CHLORIDE 0.9 % IV BOLUS
1000.0000 mL | Freq: Once | INTRAVENOUS | Status: AC
  Administered 2024-07-03: 1000 mL via INTRAVENOUS

## 2024-07-03 NOTE — Assessment & Plan Note (Addendum)
 89 year old male presenting to ED for unwitnessed fall vs. Syncope. He was found down on the ground by his wife and was confused. In ED found to have multiple issues. He was back to baseline and no longer altered/confused when admitted.  -obs to progressive  -Unwitnessed fall, so unsure if true syncope in setting of alcohol  -Patient denies falling or passing out, denies alcohol, but ethanol 127 -EKG with new junctional rhythm  -Keep on tele  -CTA head/neck with right occluded carotid, acuity unknown. No urgent intervention, no neurological findings. Discussed with vascular  -Check orthostatics -Check echo  -Met SIRS criteria, but low suspicion for infection. Parameters likely secondary to alcohol. Repeat labs have not been drawn. Trend PCT, lactic acidosis resolved

## 2024-07-03 NOTE — H&P (Signed)
 " History and Physical    Patient: Dennis Griffin FMW:[id number] DOB: 01/10/1931 DOA: 07/03/2024 DOS: the patient was seen and examined on 07/03/2024 PCP: Yolande Toribio MATSU, MD  Patient coming from: Home - lives with his wife. He uses cane and walker at times.    Chief Complaint: found down on ground   HPI: Dennis Griffin is a 89 y.o. male with medical history significant of BPH, DDD lumbar spine, HLD, HTN who presented to ED after being found down on the ground by his wife. Unknown length of time. She tried to help him up and he fell again. He was giddy and confused. She took him to urgent care and was sent to ED.   His wife tells history. She got up close to 9AM and he usually gets her a cup of coffee. He leaves everything ready for her in the bathroom. She states nothing was normal in the bathroom and went to find him. She found him on the floor in the living room. She asked what happened and he said he fell on the floor. The lamp was broken. She tried to help him up and was able to get him 1/2 way up and he fell down again. She was able to get him to a recliner then called her son. She also reports he was a little bit confused. He is back to baseline now. He tells me is that he was on the floor trying to pick up the lamp shade and that he didn't fall.    He has been feeling good. Denies any fever/chills, vision changes/headaches, chest pain or palpitations, shortness of breath or cough, abdominal pain, N/V/D, dysuria or leg swelling.   He does not smoke and denies any alcohol. He denies any alcohol despite having an elevated ethanol level.   ER Course:  vitals: temp: 95.6, bp: 138/85, HR: 83, RR: 15, oxygen: 93% RA Pertinent labs: creatinine: 1.58, ethanol: 127, lactic acid: 2.0, hgb: 9.3, platelets 116, UA: RBC 6-10.  CXR: mild cardiomegaly with mild central vascular congestion. No focal consolidation.  CT cervical spine: no acute fracture. Multilevel cervical DDD.  CTA head/neck:  Cervical occlusion of the right internal carotid artery, uncertain acuity 2. No left carotid stenosis 3. No large vessel intracranial occlusion 4. No acute abnormality on the head CT CT chest/abdomen/pelvis: Small clustered ground-glass nodular densities in the posterior right upper lobe, likely infectious or inflammatory bronchiolitis. 2. Mild cardiomegaly and small pericardial effusion. 3. No acute traumatic injury in the chest, abdomen, and pelvis. 4. 1.4 cm cystic lesion in the pancreatic tail . If felt clinically indicated, this could be further evaluated with nonemergent outpatient MRI. In ED: given 1L IVF, BC obtained and given dose of vanc and zosyn .    Review of Systems: As mentioned in the history of present illness. All other systems reviewed and are negative. Past Medical History:  Diagnosis Date   BNC (bladder neck contracture)    BPH (benign prostatic hypertrophy)    DDD (degenerative disc disease), lumbar    Frequency of urination    Hyperlipemia    Hypertension    Nocturia    PONV (postoperative nausea and vomiting) 1973   after ORIF rt arm   Spinal stenosis    Urgency of urination    Past Surgical History:  Procedure Laterality Date   BACK SURGERY     CATARACT EXTRACTION W/ INTRAOCULAR LENS  IMPLANT, BILATERAL     EYE SURGERY     HEMIARTHROPLASTY SHOULDER  FRACTURE  1996   RIGHT--  TRAMATIC INJURY/ AVASCULAR NECROSIS   JOINT REPLACEMENT     KNEE ARTHROSCOPY  2002   LEFT   LUMBAR FUSION  05-08-2009   L5 - S1   ORIF ARM FX  1973   PARTIAL KNEE ARTHROPLASTY Right 03/30/2017   Procedure: Right knee medial unicompartmental arthroplasty;  Surgeon: Melodi Lerner, MD;  Location: [facility];  Service: Orthopedics;  Laterality: Right;   TONSILLECTOMY  1942   TOTAL KNEE ARTHROPLASTY  09-04-2001   LEFT   TRANSURETHRAL RESECTION OF PROSTATE  26 YRS AGO   AND REMOVAL OF PROSTATIC STONE   TRANSURETHRAL RESECTION OF PROSTATE  09/20/2011   Procedure: TRANSURETHRAL RESECTION OF  THE PROSTATE WITH GYRUS INSTRUMENTS;  Surgeon: Toribio Neysa Repine, MD;  Location: [facility];  Service: Urology;  Laterality: N/A;   Social History:  reports that he quit smoking about 59 years ago. His smoking use included cigarettes. He started smoking about 74 years ago. He has never used smokeless tobacco. He reports that he does not drink alcohol and does not use drugs.  Allergies[1]  History reviewed. No pertinent family history.  Prior to Admission medications  Medication Sig Start Date End Date Taking? Authorizing Provider  azelastine  (OPTIVAR ) 0.05 % ophthalmic solution Place 1 drop into both eyes 2 (two) times daily. 08/26/23  Yes Juleen Rush, PA-C  doxazosin  (CARDURA ) 8 MG tablet Take 4 mg by mouth daily.  01/14/11  Yes [provider]  simvastatin  (ZOCOR ) 40 MG tablet Take 20 mg by mouth daily.    Yes [provider]  azithromycin  (ZITHROMAX ) 250 MG tablet Take 1 tablet (250 mg total) by mouth daily. Take first 2 tablets together, then 1 every day until finished. Patient not taking: Reported on 07/03/2024 05/24/24   Mayer, Jodi R, NP  gabapentin  (NEURONTIN ) 600 MG tablet Take 300-600 mg by mouth daily as needed (depends on pain if takes 300-600 mg).  12/31/14   [provider]    Physical Exam: Vitals:   07/03/24 1930 07/03/24 1947 07/03/24 2145 07/03/24 2200  BP: (!) 180/62   (!) 191/76  Pulse:    76  Resp: 10  19 18   Temp:  98.2 F (36.8 C)  98.1 F (36.7 C)  TempSrc:  Oral    SpO2:    98%   General:  Appears calm and comfortable and is in NAD Eyes:  PERRL, EOMI, normal lids, iris ENT:  grossly normal hearing, lips & tongue, mmm; appropriate dentition Neck:  no LAD, masses or thyromegaly; no carotid bruits Cardiovascular:  RRR, no m/r/g. No LE edema.  Respiratory:   CTA bilaterally with no wheezes/rales/rhonchi.  Normal respiratory effort. Abdomen:  soft, NT, ND, NABS Back:   normal alignment, no CVAT Skin:  no rash or  induration seen on limited exam Musculoskeletal:  grossly normal tone BUE/BLE, good ROM, no bony abnormality Lower extremity:  No LE edema.  Limited foot exam with no ulcerations.  2+ distal pulses. Psychiatric:  grossly normal mood and affect, speech fluent and appropriate, AOx3 Neurologic:  CN 2-12 grossly intact, moves all extremities in coordinated fashion, sensation intact   Radiological Exams on Admission: Independently reviewed - see discussion in A/P where applicable  CT ANGIO HEAD NECK W WO CM Result Date: 07/03/2024 CLINICAL DATA:  Altered baseline mental status EXAM: CT ANGIOGRAPHY HEAD AND NECK WITH AND WITHOUT CONTRAST TECHNIQUE: Multidetector CT imaging of the head and neck was performed using the standard protocol during bolus  administration of intravenous contrast. Multiplanar CT image reconstructions and MIPs were obtained to evaluate the vascular anatomy. Carotid stenosis measurements (when applicable) are obtained utilizing NASCET criteria, using the distal internal carotid diameter as the denominator. RADIATION DOSE REDUCTION: This exam was performed according to the departmental dose-optimization program which includes automated exposure control, adjustment of the mA and/or kV according to patient size and/or use of iterative reconstruction technique. CONTRAST:  75mL OMNIPAQUE  IOHEXOL  350 MG/ML SOLN COMPARISON:  None Available. FINDINGS: CT HEAD: The brain parenchyma appears normal. There is no hemorrhage. No acute ischemic changes. No mass lesion. The ventricles are normal. Skull/sinuses/orbits: Chronic right maxillary sinus dizzy CTA NECK: CTA NECK Aortic arch: There is plaque without proximal vessel stenosis Right carotid: The internal carotid artery is occluded at its origin. Left carotid: There is plaque in the left common carotid bifurcation without significant stenosis of the internal carotid artery Right vertebral: Dominant, patent without significant stenosis Left vertebral:  Non dominant, patent without significant stenosis Soft tissues: No significant abnormality Other comments: None CTA HEAD: CTA HEAD Right anterior circulation: The intracranial circulation is supplied by a patent anterior communicating artery, small right A1 segment and large right posterior communicating artery. The anterior and middle cerebral arteries are patent without proximal branch occlusion. Left anterior circulation: The internal carotid artery is patent without significant stenosis. The anterior and middle cerebral arteries are patent without significant stenosis or proximal branch occlusion. No aneurysm. Posterior circulation: Both vertebral arteries are patent. There is no significant basilar stenosis. Both posterior cerebral arteries are patent without significant stenosis or proximal branch occlusion. No aneurysm. IMPRESSION: 1. 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 No left carotid stenosis 3. No large vessel intracranial occlusion 4. No acute abnormality on the head CT Electronically Signed   By: Nancyann Burns M.D.   On: 07/03/2024 15:27   CT CHEST ABDOMEN PELVIS W CONTRAST Result Date: 07/03/2024 EXAM: CT CHEST, ABDOMEN AND PELVIS WITH CONTRAST 07/03/2024 02:10:02 PM TECHNIQUE: CT of the chest, abdomen and pelvis was performed with the administration of 75 mL iohexol  (OMNIPAQUE ) 350 MG/ML injection. Multiplanar reformatted images are provided for review. Automated exposure control, iterative reconstruction, and/or weight based adjustment of the mA/kV was utilized to reduce the radiation dose to as low as reasonably achievable. COMPARISON: None available. CLINICAL HISTORY: fall, altered mental status FINDINGS: CHEST: MEDIASTINUM AND LYMPH NODES: Mild cardiomegaly. Small pericardial effusion. Aortic atherosclerosis. The central airways are clear. No mediastinal, hilar or axillary lymphadenopathy. LUNGS AND PLEURA: Small clustered ground-glass nodular densities  in the posterior right upper lobe, likely infectious or inflammatory bronchiolitis. No pulmonary edema. Calcified pleural plaques bilaterally. Trace right pleural effusion. No pneumothorax. ABDOMEN AND PELVIS: LIVER: Benign appearing 1.4 cm cyst in the left hepatic lobe. GALLBLADDER AND BILE DUCTS: Gallbladder is unremarkable. No biliary ductal dilatation. SPLEEN: No acute abnormality. PANCREAS: 1.4 cm cystic area in the pancreatic tail. ADRENAL GLANDS: No acute abnormality. KIDNEYS, URETERS AND BLADDER: Bilateral simple appearing renal cysts. Per consensus, no follow-up is needed for simple Bosniak type 1 and 2 renal cysts, unless the patient has a malignancy history or risk factors. Bilateral perinephric stranding. No stones in the kidneys or ureters. No hydronephrosis. No periureteral stranding. Urinary bladder is unremarkable. GI AND BOWEL: Stomach demonstrates no acute abnormality. Colonic diverticulosis. Normal appendix. There is no bowel obstruction. REPRODUCTIVE ORGANS: No acute abnormality. PERITONEUM AND RETROPERITONEUM: No ascites. No free air. VASCULATURE: Aorta is normal in caliber. ABDOMINAL AND PELVIS LYMPH NODES: No lymphadenopathy.  BONES AND SOFT TISSUES: Postoperative changes in the lumbar spine. No acute osseous abnormality. No focal soft tissue abnormality. IMPRESSION: 1. Small clustered ground-glass nodular densities in the posterior right upper lobe, likely infectious or inflammatory bronchiolitis. 2. Mild cardiomegaly and small pericardial effusion. 3. No acute traumatic injury in the chest, abdomen, and pelvis. 4. 1.4 cm cystic lesion in the pancreatic tail . If felt clinically indicated, this could be further evaluated with nonemergent outpatient MRI. Electronically signed by: Franky Crease MD 07/03/2024 03:13 PM EST RP Workstation: [id number]   CT Cervical Spine Wo Contrast Result Date: 07/03/2024 EXAM: CT CERVICAL SPINE WITH CONTRAST 07/03/2024 02:10:02 PM TECHNIQUE: CT of the cervical  spine was performed with the administration of intravenous contrast. Multiplanar reformatted images are provided for review. Automated exposure control, iterative reconstruction, and/or weight based adjustment of the mA/kV was utilized to reduce the radiation dose to as low as reasonably achievable. COMPARISON: None available. CLINICAL HISTORY: FALL FINDINGS: BONES AND ALIGNMENT: No acute fracture. No acute posttraumatic malalignment. 3 mm retrolisthesis of C4 on C5, which is most likely due to chronic spondylosis. . DEGENERATIVE CHANGES: Multilevel disc space narrowing and endplate spurring are noted at C4-C5, C5-C6, and C6-C7. SOFT TISSUES: No prevertebral soft tissue swelling. VASCULATURE: Bilateral carotid artery calcifications. IMPRESSION: 1. No signs of acute cervical spine fracture or subluxation. 2. Multilevel cervical degenerative disc disease. Electronically signed by: Waddell Calk MD 07/03/2024 03:09 PM EST RP Workstation: [id number]   DG Chest Portable 1 View Result Date: 07/03/2024 CLINICAL DATA:  Questionable sepsis. EXAM: PORTABLE CHEST 1 VIEW COMPARISON:  Chest radiograph dated 07/04/2023. FINDINGS: Mild cardiomegaly with mild central vascular congestion. No focal consolidation, pleural effusion, or pneumothorax. Right shoulder arthroplasty. No acute osseous pathology. IMPRESSION: Mild cardiomegaly with mild central vascular congestion. No focal consolidation. Electronically Signed   By: Vanetta Chou M.D.   On: 07/03/2024 14:36    EKG: Independently reviewed.  Sinus bradycardia with rate 48, junctional rhythm; nonspecific ST changes with no evidence of acute ischemia   Labs on Admission: I have personally reviewed the available labs and imaging studies at the time of the admission.  Pertinent labs:   creatinine: 1.58, ethanol: 127, lactic acid: 2.0,  hgb: 9.3,  platelets 116  Assessment and Plan: Principal Problem:   Syncope and collapse Active Problems:   Hypothermia    SIRS (systemic inflammatory response syndrome) (HCC)   Pancytopenia (HCC)   Right carotid artery occlusion   Pancreatic lesion   Microscopic hematuria   Alcohol intoxication   Stage 3b chronic kidney disease (HCC)   Essential hypertension   Hyperlipidemia    Assessment and Plan: * Syncope and collapse 89 year old male presenting to ED for unwitnessed fall vs. Syncope. He was found down on the ground by his wife and was confused. In ED found to have multiple issues. He was back to baseline and no longer altered/confused when admitted.  -obs to progressive  -Unwitnessed fall, so unsure if true syncope in setting of alcohol  -Patient denies falling or passing out, denies alcohol, but ethanol 127 -EKG with new junctional rhythm  -Keep on tele  -CTA head/neck with right occluded carotid, acuity unknown. No urgent intervention, no neurological findings. Discussed with vascular  -Check orthostatics -Check echo  -Met SIRS criteria, but low suspicion for infection. Parameters likely secondary to alcohol. Repeat labs have not been drawn. Trend PCT, lactic acidosis resolved   Hypothermia Initially presenting to ED with temp of 95.6 requiring bear hugger Temperature normalized and  was wnl in ED  Likely secondary to alcohol intoxication, but sepsis w/u initiated    SIRS (systemic inflammatory response syndrome) (HCC) Met SIRS criteria with hypothermia and leukopenia BC obtained  UA with microscopic hematuria, but no findings of infection  CT chest with infectious vs. Inflammatory bronchiolitis. He has no complaints of cough or shortness of breath  SIRS criteria resolving, temperature normalized Check PCT and trend Lactic acidosis resolved Received vanc/zosyn  in ED, hold on further abx at this time    Pancytopenia (HCC) Hgb of 10.9 in 2018 Hx of pancytopenia in chart Unsure if at baseline or acute on chronic anemia Check iron studies, B12/folate with ETOH Colonoscopy in 2009 with  diverticulosis and polyps that were biopsied  Fecal occult  Trend   Right carotid artery occlusion CTA head/neck showing cervical occlusion of the right internal carotid artery, acuity unknown No neuro deficits on exam. Discussed with vascular, no acute intervention   Pancreatic lesion Incidental finding on CT abdomen.  1.4cm cystic lesion in pancreatic tail  Consider outpatient MRI if appropriate   Microscopic hematuria Repeat UA in AM F/u outpatient   Alcohol intoxication Ethanol on arrival 127>84 Likely contributing to fall  CIWA protocol   Stage 3b chronic kidney disease (HCC) Creatinine from 2018 at 1.3.  Likely at baseline, continue to monitor   Essential hypertension On no anti HTN medication  Follow   Hyperlipidemia Continue zocor  20mg  daily      Advance Care Planning:   Code Status: Full Code    Consultants: PT   Procedures: none   Antibiotics: Vanc/zosyn : 12/30     DVT Prophylaxis: SCDs  Family Communication: wife and son at bedside   Severity of Illness: The appropriate patient status for this patient is OBSERVATION. Observation status is judged to be reasonable and necessary in order to provide the required intensity of service to ensure the patient's safety. The patient's presenting symptoms, physical exam findings, and initial radiographic and laboratory data in the context of their medical condition is felt to place them at decreased risk for further clinical deterioration. Furthermore, it is anticipated that the patient will be medically stable for discharge from the hospital within 2 midnights of admission.   Author: Isaiah Geralds, MD 07/03/2024 10:06 PM  For on call review [email or web address].      [1]  Allergies Allergen Reactions   Penicillins Rash    Has patient had a PCN reaction causing immediate rash, facial/tongue/throat swelling, SOB or lightheadedness with hypotension: Yes Has patient had a PCN reaction causing severe rash  involving mucus membranes or skin necrosis: No Has patient had a PCN reaction that required hospitalization: Yes Has patient had a PCN reaction occurring within the last 10 years: No If all of the above answers are NO, then may proceed with Cephalosporin use.    "

## 2024-07-03 NOTE — Assessment & Plan Note (Addendum)
 Hgb of 10.9 in 2018 Hx of pancytopenia in chart Unsure if at baseline or acute on chronic anemia Check iron studies, B12/folate with ETOH Colonoscopy in 2009 with diverticulosis and polyps that were biopsied  Fecal occult  Trend

## 2024-07-03 NOTE — Assessment & Plan Note (Addendum)
 CTA head/neck showing cervical occlusion of the right internal carotid artery, acuity unknown No neuro deficits on exam. Discussed with vascular, no acute intervention

## 2024-07-03 NOTE — ED Provider Notes (Signed)
 89 yo male from home, found on floor by wife, unknown down time, altered since this morning.  Given vanc and zosyn  for sepsis concerns due to hypothermia on arrival.  Add on CK for unknown down time Fluids for elevated lactic, needs repeat.  Physical Exam  BP 116/77 (BP Location: Right Arm)   Pulse 68   Temp 97.8 F (36.6 C) (Oral)   Resp 15   SpO2 93%   Physical Exam  Procedures  Procedures  ED Course / MDM   Clinical Course as of 07/03/24 1651  Tue Jul 03, 2024  1537 Personally evaluated patient, patient reportedly was found down, and altered by family.  Patient has had improving mental status since being in the emergency department.  Mental status has improved since being in the emergency department, on my evaluation at approximately 330, family feels that patient is back to baseline. Ethanol elevated, patient reportedly received moonshine as a Christmas gift, however denies ingesting alcohol. [LS]    Clinical Course User Index [LS] Rogelia Jerilynn RAMAN, MD   Medical Decision Making Amount and/or Complexity of Data Reviewed Labs: ordered. Radiology: ordered.  Risk Prescription drug management. Decision regarding hospitalization.   CK not significantly elevated. CTA with right internal carotid occlusion. Temp improved to 97.8.   Concern for syncope vs fall due to intoxication. Plan for admission for further monitoring given hypothermic state on arrival with elevated lactic with non specific findings on ER evaluation today.   Case discussed with Dr. Waddell with [facility] hospitalist service who will consult for admission.       Beverley Leita LABOR, PA-C 07/03/24 1651    Rogelia Jerilynn RAMAN, MD 07/04/24 [phone]

## 2024-07-03 NOTE — ED Provider Notes (Signed)
 " [facility], Television/film[employer] - [facility]  Note:  This document was prepared using Conservation officer, historic buildings and may include unintentional dictation errors.  MRN: [id number] DOB: [birth year]  Subjective:   Dennis Griffin is a 89 y.o. male presenting for suffering 2 episodes of syncope and collapse this morning.  Initially, patient was found on the floor by his wife.  Does not know how long he had been on the floor but reports that she saw him trying to get up.  She did witness the second syncope and collapse.  This was after he got up out of his chair, ended up falling and his head, breaking the lamp.  Patient reports that he does not recall the falls.  Denies headache, confusion, weakness, numbness or tingling, chest pain, heart racing, palpitations, dysuria, urinary changes, nausea, vomiting, belly pain.  Patient's wife reports that he did behave normally after his falls.  No history of stroke, neurologic conditions, cardiac conditions, arrhythmias.  No smoking of any kind including cigarettes, cigars, vaping, marijuana use.  No drug use.  No alcohol use.  Current Outpatient Medications  Medication Instructions   azelastine  (OPTIVAR ) 0.05 % ophthalmic solution 1 drop, Both Eyes, 2 times daily   azithromycin  (ZITHROMAX ) 250 mg, Oral, Daily, Take first 2 tablets together, then 1 every day until finished.   doxazosin  (CARDURA ) 4 mg, Daily   gabapentin  (NEURONTIN ) 300-600 mg, Daily PRN   simvastatin  (ZOCOR ) 20 mg, Daily    Allergies[1]  Past Medical History:  Diagnosis Date   BNC (bladder neck contracture)    BPH (benign prostatic hypertrophy)    DDD (degenerative disc disease), lumbar    Frequency of urination    Hyperlipemia    Hypertension    Nocturia    PONV (postoperative nausea and vomiting) 1973   after ORIF rt arm   Spinal stenosis    Urgency of urination      Past Surgical History:  Procedure Laterality Date   BACK SURGERY     CATARACT EXTRACTION W/ INTRAOCULAR LENS   IMPLANT, BILATERAL     EYE SURGERY     HEMIARTHROPLASTY SHOULDER FRACTURE  1996   RIGHT--  TRAMATIC INJURY/ AVASCULAR NECROSIS   JOINT REPLACEMENT     KNEE ARTHROSCOPY  2002   LEFT   LUMBAR FUSION  05-08-2009   L5 - S1   ORIF ARM FX  1973   PARTIAL KNEE ARTHROPLASTY Right 03/30/2017   Procedure: Right knee medial unicompartmental arthroplasty;  Surgeon: Melodi Lerner, MD;  Location: [facility];  Service: Orthopedics;  Laterality: Right;   TONSILLECTOMY  1942   TOTAL KNEE ARTHROPLASTY  09-04-2001   LEFT   TRANSURETHRAL RESECTION OF PROSTATE  26 YRS AGO   AND REMOVAL OF PROSTATIC STONE   TRANSURETHRAL RESECTION OF PROSTATE  09/20/2011   Procedure: TRANSURETHRAL RESECTION OF THE PROSTATE WITH GYRUS INSTRUMENTS;  Surgeon: Toribio Neysa Repine, MD;  Location: [facility];  Service: Urology;  Laterality: N/A;    No family history on file.  Social History   Occupational History   Not on file  Tobacco Use   Smoking status: Former    Current packs/day: 0.00    Types: Cigarettes    Start date: 07/23/1949    Quit date: 07/23/1964    Years since quitting: 59.9   Smokeless tobacco: Never  Vaping Use   Vaping status: Never Used  Substance and Sexual Activity   Alcohol use: No   Drug use: No   Sexual activity:  Not on file     ROS   Objective:   Vitals: BP (!) 147/61 (BP Location: Left Arm)   Pulse (!) 48   Temp (!) 95.5 F (35.3 C) (Temporal) Comment: oral would not register after 2 attempts  Resp (!) 24   SpO2 91%   Physical Exam Constitutional:      General: He is not in acute distress.    Appearance: Normal appearance. He is well-developed. He is not ill-appearing, toxic-appearing or diaphoretic.  HENT:     Head: Normocephalic and atraumatic. No raccoon eyes, Battle's sign, abrasion, contusion, masses, right periorbital erythema, left periorbital erythema or laceration. Hair is normal.     Jaw: No trismus, tenderness, swelling, pain on movement or  malocclusion.     Comments: No tenderness of the scalp or head.  No open wounds or bleeding.    Right Ear: External ear normal.     Left Ear: External ear normal.     Nose: Nose normal.     Mouth/Throat:     Mouth: Mucous membranes are moist.  Eyes:     General: No scleral icterus.       Right eye: No discharge.        Left eye: No discharge.     Extraocular Movements: Extraocular movements intact.  Neck:     Vascular: No carotid bruit.  Cardiovascular:     Rate and Rhythm: Regular rhythm. Bradycardia present.     Heart sounds: Normal heart sounds. No murmur heard.    No friction rub. No gallop.  Pulmonary:     Effort: Pulmonary effort is normal. No respiratory distress.     Breath sounds: Normal breath sounds. No stridor. No wheezing, rhonchi or rales.  Neurological:     Mental Status: He is alert and oriented to person, place, and time.     Cranial Nerves: No cranial nerve deficit.     Motor: No weakness.     Coordination: Coordination abnormal.     Gait: Gait normal.  Psychiatric:        Mood and Affect: Mood normal.        Behavior: Behavior normal.        Thought Content: Thought content normal.     Results for orders placed or performed during the hospital encounter of 07/03/24 (from the past 24 hours)  POCT CBG (manual entry)     Status: Abnormal   Collection Time: 07/03/24 11:29 AM  Result Value Ref Range   POCT Glucose ([facility]) 138 (A) 70 - 99 mg/dL   ED ECG REPORT   Date: 07/03/2024  EKG Time: 11:51 AM  Rate: 48 BPM  Rhythm: Junctional bradycardia with occasional premature ventricular contractions  Axis: Normal  Intervals: Prolonged PR interval  ST&T Change: T wave flattening in lead aVL, V2, V3  Narrative Interpretation: Junctional rhythm with bradycardia 48 bpm with occasional PVCs, nonspecific T wave changes starkly different from prior EKGs.  Assessment and Plan :   PDMP not reviewed this encounter.  1. Syncope and collapse   2. Multiple falls   3.  Nonspecific abnormal electrocardiogram (ECG) (EKG)   4. Bradycardia      Patient is in need of a higher level of care than we can provide in the urgent care setting.  This includes extensive workup for his syncope and collapse, abnormal EKG, bradycardia as well as ruling out consequential head injury.  This was discussed with the patient's wife and his son were agreeable to present to  the emergency room by personal vehicle.      [1]  Allergies Allergen Reactions   Penicillins Rash    Has patient had a PCN reaction causing immediate rash, facial/tongue/throat swelling, SOB or lightheadedness with hypotension: Yes Has patient had a PCN reaction causing severe rash involving mucus membranes or skin necrosis: No Has patient had a PCN reaction that required hospitalization: Yes Has patient had a PCN reaction occurring within the last 10 years: No If all of the above answers are NO, then may proceed with Cephalosporin use.      Christopher Savannah, [place] 07/03/24 1157  "

## 2024-07-03 NOTE — Assessment & Plan Note (Signed)
 Initially presenting to ED with temp of 95.6 requiring bear hugger Temperature normalized and was wnl in ED  Likely secondary to alcohol intoxication, but sepsis w/u initiated

## 2024-07-03 NOTE — ED Triage Notes (Signed)
 Pt arrived via POV, with spouse. Per spouse, pt is altered from baseline. Has a fall, unwitnessed and unknown down time but was indoors. Denies any pain, CP, lightheadedness. States he does not know why he is here but able to answer questions.   HR intermittently in 30s, placed on ZOLL Pads.  Rectal temp hypothermic, placed on warming blanket.

## 2024-07-03 NOTE — Assessment & Plan Note (Signed)
 On no anti HTN medication  Follow

## 2024-07-03 NOTE — Assessment & Plan Note (Signed)
 Met SIRS criteria with hypothermia and leukopenia BC obtained  UA with microscopic hematuria, but no findings of infection  CT chest with infectious vs. Inflammatory bronchiolitis. He has no complaints of cough or shortness of breath  SIRS criteria resolving, temperature normalized Check PCT and trend Lactic acidosis resolved Received vanc/zosyn  in ED, hold on further abx at this time

## 2024-07-03 NOTE — ED Triage Notes (Addendum)
 Pt brought to tx room via w/c-pt is alert and stood from w/c to stretcher chair-states his name and DOB and place as urgent care. I've been here before-when asked why he is here he states I have no idea-wife states I think he has had a stroke- and that pt fell twice this am-states she is unsure how long he had been on the floor with the first fall when she found him ~915a-pt fell again after getting up from first fall-wife states pt was laughing and stated he was alright after the falls-she states pt's eyes were big and blurry and he's got a bump on the back of his head she is unsure of LOC-pt denies pain-NAD

## 2024-07-03 NOTE — Assessment & Plan Note (Signed)
Continue zocor 20 mg daily 

## 2024-07-03 NOTE — ED Notes (Signed)
 [address] called prior to moving patient to floor.

## 2024-07-03 NOTE — Progress Notes (Signed)
 ED Pharmacy Antibiotic Sign Off An antibiotic consult was received from an ED provider for vancomycin  and zosyn  per pharmacy dosing for concern for sepsis. A chart review was completed to assess appropriateness.   The following one time order(s) were placed:  Vancomycin  1500mg  IV x1  Zosyn  3.375mg  IV x1 (30 minute infusion)   Further antibiotic and/or antibiotic pharmacy consults should be ordered by the admitting provider if indicated.   Thank you for allowing pharmacy to be a part of this patient's care.   Marget Hench, [facility]  Clinical Pharmacist 07/03/2024 12:51 PM

## 2024-07-03 NOTE — Assessment & Plan Note (Signed)
 Creatinine from 2018 at 1.3.  Likely at baseline, continue to monitor

## 2024-07-03 NOTE — Discharge Instructions (Signed)
 Please head straight to the emergency room now as you are in need of a higher level of care than we can provide in the urgent care setting. This includes rule out of a head injury from his fall as well as testing to evaluate for why he passed out such as an abnormal heart rhythm, stroke.

## 2024-07-03 NOTE — ED Provider Notes (Signed)
 " [place] EMERGENCY DEPARTMENT AT [facility] Provider Note   CSN: [id number] Arrival date & time: 07/03/24  1204     Patient presents with: Bradycardia   Dennis Griffin is a 89 y.o. male who presents emergency department with a chief complaint of altered mental status.  Patient lives at home with his elderly wife and he had a unwitnessed fall.  Patient was down for an unknown amount of time but was indoors.  Patient was arousable and awake whenever he was found.  Patient was initially brought to urgent care and denied any pain including chest pain or lightheadedness.  Urgent care subsequently referred him to the emergency department.  Patient denies new medication use, known infectious symptoms, alcohol or drug use.  Past medical history significant for hypertension, hyperlipidemia, BPH, spinal stenosis, degenerative disc disease, etc.  Patient denies recent antibiotic use.  Wife states that patient's mental status is abnormal, states that he has been laughing more than usual and has had some odd movements.  No blood thinner.   HPI     Prior to Admission medications  Medication Sig Start Date End Date Taking? Authorizing Provider  azelastine  (OPTIVAR ) 0.05 % ophthalmic solution Place 1 drop into both eyes 2 (two) times daily. 08/26/23  Yes Juleen Rush, PA-C  doxazosin  (CARDURA ) 8 MG tablet Take 4 mg by mouth daily.  01/14/11  Yes [provider]  simvastatin  (ZOCOR ) 40 MG tablet Take 20 mg by mouth daily.    Yes [provider]  azithromycin  (ZITHROMAX ) 250 MG tablet Take 1 tablet (250 mg total) by mouth daily. Take first 2 tablets together, then 1 every day until finished. Patient not taking: Reported on 07/03/2024 05/24/24   Mayer, Jodi R, NP  gabapentin  (NEURONTIN ) 600 MG tablet Take 300-600 mg by mouth daily as needed (depends on pain if takes 300-600 mg).  12/31/14   [provider]    Allergies: Penicillins    Review of Systems   Neurological:        AMS    Updated Vital Signs BP 116/77 (BP Location: Right Arm)   Pulse 68   Temp 97.8 F (36.6 C) (Oral)   Resp 15   SpO2 93%   Physical Exam Vitals and nursing note reviewed.  Constitutional:      General: He is awake. He is not in acute distress.    Appearance: He is not ill-appearing, toxic-appearing or diaphoretic.  HENT:     Head: Normocephalic and atraumatic.  Eyes:     General: No visual field deficit or scleral icterus.    Extraocular Movements: Extraocular movements intact.     Pupils: Pupils are equal, round, and reactive to light.  Neck:     Comments: Patient able to look left, right, touch and to chest, and look up at the ceiling without significant discomfort, no cervical spine tenderness Cardiovascular:     Rate and Rhythm: Normal rate and regular rhythm.  Pulmonary:     Effort: Pulmonary effort is normal. No respiratory distress.     Breath sounds: Normal breath sounds. No stridor. No wheezing, rhonchi or rales.  Abdominal:     General: Abdomen is flat.     Palpations: Abdomen is soft.     Tenderness: There is no abdominal tenderness. There is no guarding or rebound.  Musculoskeletal:        General: Normal range of motion.     Cervical back: Normal range of motion. No rigidity or tenderness.  Right lower leg: No edema.     Left lower leg: No edema.     Comments: Normal range of motion of all 4 extremities, no tenderness with palpation of bilateral shoulders, arms, no chest wall tenderness, pelvis feels stable, no hip tenderness or femur tenderness, patient is able to stand, no lower extremity tenderness  Skin:    General: Skin is warm.     Capillary Refill: Capillary refill takes less than 2 seconds.     Comments: No obvious open wounds, patient does have a small hematoma present to his posterior scalp, not actively bleeding  Neurological:     Mental Status: He is alert and oriented to person, place, and time.     GCS: GCS eye  subscore is 4. GCS verbal subscore is 5. GCS motor subscore is 6.     Cranial Nerves: No dysarthria or facial asymmetry.     Sensory: Sensation is intact.     Motor: Motor function is intact.     Coordination: Coordination is intact.     Gait: Gait is intact.     Comments: Patient neurologically intact at time of my exam, patient AxO x 4  Psychiatric:        Mood and Affect: Mood normal.        Behavior: Behavior normal. Behavior is cooperative.     (all labs ordered are listed, but only abnormal results are displayed) Labs Reviewed  COMPREHENSIVE METABOLIC PANEL WITH GFR - Abnormal; Notable for the following components:      Result Value   Glucose, Bld 113 (*)    Creatinine, Ser 1.58 (*)    GFR, Estimated 41 (*)    All other components within normal limits  URINALYSIS, W/ REFLEX TO CULTURE (INFECTION SUSPECTED) - Abnormal; Notable for the following components:   Color, Urine STRAW (*)    Hgb urine dipstick SMALL (*)    All other components within normal limits  ETHANOL - Abnormal; Notable for the following components:   Alcohol, Ethyl (B) 127 (*)    All other components within normal limits  CBC WITH DIFFERENTIAL/PLATELET - Abnormal; Notable for the following components:   WBC 3.8 (*)    RBC 3.13 (*)    Hemoglobin 9.3 (*)    HCT 28.9 (*)    Platelets 116 (*)    All other components within normal limits  I-STAT CG4 LACTIC ACID, ED - Abnormal; Notable for the following components:   Lactic Acid, Venous 2.0 (*)    All other components within normal limits  I-STAT CHEM 8, ED - Abnormal; Notable for the following components:   Creatinine, Ser 1.70 (*)    Glucose, Bld 104 (*)    TCO2 21 (*)    Hemoglobin 9.9 (*)    HCT 29.0 (*)    All other components within normal limits  CULTURE, BLOOD (ROUTINE X 2)  CULTURE, BLOOD (ROUTINE X 2)  PROTIME-INR  APTT  URINE DRUG SCREEN  CBC WITH DIFFERENTIAL/PLATELET  ETHANOL  CK  CBG MONITORING, ED  I-STAT CG4 LACTIC ACID, ED     EKG: None  Radiology: CT ANGIO HEAD NECK W WO CM Result Date: 07/03/2024 CLINICAL DATA:  Altered baseline mental status EXAM: CT ANGIOGRAPHY HEAD AND NECK WITH AND WITHOUT CONTRAST TECHNIQUE: Multidetector CT imaging of the head and neck was performed using the standard protocol during bolus administration of intravenous contrast. Multiplanar CT image reconstructions and MIPs were obtained to evaluate the vascular anatomy. Carotid stenosis measurements (when applicable)  are obtained utilizing NASCET criteria, using the distal internal carotid diameter as the denominator. RADIATION DOSE REDUCTION: This exam was performed according to the departmental dose-optimization program which includes automated exposure control, adjustment of the mA and/or kV according to patient size and/or use of iterative reconstruction technique. CONTRAST:  75mL OMNIPAQUE  IOHEXOL  350 MG/ML SOLN COMPARISON:  None Available. FINDINGS: CT HEAD: The brain parenchyma appears normal. There is no hemorrhage. No acute ischemic changes. No mass lesion. The ventricles are normal. Skull/sinuses/orbits: Chronic right maxillary sinus dizzy CTA NECK: CTA NECK Aortic arch: There is plaque without proximal vessel stenosis Right carotid: The internal carotid artery is occluded at its origin. Left carotid: There is plaque in the left common carotid bifurcation without significant stenosis of the internal carotid artery Right vertebral: Dominant, patent without significant stenosis Left vertebral: Non dominant, patent without significant stenosis Soft tissues: No significant abnormality Other comments: None CTA HEAD: CTA HEAD Right anterior circulation: The intracranial circulation is supplied by a patent anterior communicating artery, small right A1 segment and large right posterior communicating artery. The anterior and middle cerebral arteries are patent without proximal branch occlusion. Left anterior circulation: The internal carotid artery  is patent without significant stenosis. The anterior and middle cerebral arteries are patent without significant stenosis or proximal branch occlusion. No aneurysm. Posterior circulation: Both vertebral arteries are patent. There is no significant basilar stenosis. Both posterior cerebral arteries are patent without significant stenosis or proximal branch occlusion. No aneurysm. IMPRESSION: 1. 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 No left carotid stenosis 3. No large vessel intracranial occlusion 4. No acute abnormality on the head CT Electronically Signed   By: Nancyann Burns M.D.   On: 07/03/2024 15:27   CT CHEST ABDOMEN PELVIS W CONTRAST Result Date: 07/03/2024 EXAM: CT CHEST, ABDOMEN AND PELVIS WITH CONTRAST 07/03/2024 02:10:02 PM TECHNIQUE: CT of the chest, abdomen and pelvis was performed with the administration of 75 mL iohexol  (OMNIPAQUE ) 350 MG/ML injection. Multiplanar reformatted images are provided for review. Automated exposure control, iterative reconstruction, and/or weight based adjustment of the mA/kV was utilized to reduce the radiation dose to as low as reasonably achievable. COMPARISON: None available. CLINICAL HISTORY: fall, altered mental status FINDINGS: CHEST: MEDIASTINUM AND LYMPH NODES: Mild cardiomegaly. Small pericardial effusion. Aortic atherosclerosis. The central airways are clear. No mediastinal, hilar or axillary lymphadenopathy. LUNGS AND PLEURA: Small clustered ground-glass nodular densities in the posterior right upper lobe, likely infectious or inflammatory bronchiolitis. No pulmonary edema. Calcified pleural plaques bilaterally. Trace right pleural effusion. No pneumothorax. ABDOMEN AND PELVIS: LIVER: Benign appearing 1.4 cm cyst in the left hepatic lobe. GALLBLADDER AND BILE DUCTS: Gallbladder is unremarkable. No biliary ductal dilatation. SPLEEN: No acute abnormality. PANCREAS: 1.4 cm cystic area in the pancreatic tail. ADRENAL GLANDS:  No acute abnormality. KIDNEYS, URETERS AND BLADDER: Bilateral simple appearing renal cysts. Per consensus, no follow-up is needed for simple Bosniak type 1 and 2 renal cysts, unless the patient has a malignancy history or risk factors. Bilateral perinephric stranding. No stones in the kidneys or ureters. No hydronephrosis. No periureteral stranding. Urinary bladder is unremarkable. GI AND BOWEL: Stomach demonstrates no acute abnormality. Colonic diverticulosis. Normal appendix. There is no bowel obstruction. REPRODUCTIVE ORGANS: No acute abnormality. PERITONEUM AND RETROPERITONEUM: No ascites. No free air. VASCULATURE: Aorta is normal in caliber. ABDOMINAL AND PELVIS LYMPH NODES: No lymphadenopathy. BONES AND SOFT TISSUES: Postoperative changes in the lumbar spine. No acute osseous abnormality. No focal soft tissue abnormality. IMPRESSION: 1. Small  clustered ground-glass nodular densities in the posterior right upper lobe, likely infectious or inflammatory bronchiolitis. 2. Mild cardiomegaly and small pericardial effusion. 3. No acute traumatic injury in the chest, abdomen, and pelvis. 4. 1.4 cm cystic lesion in the pancreatic tail . If felt clinically indicated, this could be further evaluated with nonemergent outpatient MRI. Electronically signed by: Franky Crease MD 07/03/2024 03:13 PM EST RP Workstation: [id number]   CT Cervical Spine Wo Contrast Result Date: 07/03/2024 EXAM: CT CERVICAL SPINE WITH CONTRAST 07/03/2024 02:10:02 PM TECHNIQUE: CT of the cervical spine was performed with the administration of intravenous contrast. Multiplanar reformatted images are provided for review. Automated exposure control, iterative reconstruction, and/or weight based adjustment of the mA/kV was utilized to reduce the radiation dose to as low as reasonably achievable. COMPARISON: None available. CLINICAL HISTORY: FALL FINDINGS: BONES AND ALIGNMENT: No acute fracture. No acute posttraumatic malalignment. 3 mm retrolisthesis  of C4 on C5, which is most likely due to chronic spondylosis. . DEGENERATIVE CHANGES: Multilevel disc space narrowing and endplate spurring are noted at C4-C5, C5-C6, and C6-C7. SOFT TISSUES: No prevertebral soft tissue swelling. VASCULATURE: Bilateral carotid artery calcifications. IMPRESSION: 1. No signs of acute cervical spine fracture or subluxation. 2. Multilevel cervical degenerative disc disease. Electronically signed by: Waddell Calk MD 07/03/2024 03:09 PM EST RP Workstation: [id number]   DG Chest Portable 1 View Result Date: 07/03/2024 CLINICAL DATA:  Questionable sepsis. EXAM: PORTABLE CHEST 1 VIEW COMPARISON:  Chest radiograph dated 07/04/2023. FINDINGS: Mild cardiomegaly with mild central vascular congestion. No focal consolidation, pleural effusion, or pneumothorax. Right shoulder arthroplasty. No acute osseous pathology. IMPRESSION: Mild cardiomegaly with mild central vascular congestion. No focal consolidation. Electronically Signed   By: Vanetta Chou M.D.   On: 07/03/2024 14:36     Procedures   Medications Ordered in the ED  sodium chloride  0.9 % bolus 1,000 mL (has no administration in time range)  vancomycin  (VANCOREADY) IVPB 1500 mg/300 mL (0 mg Intravenous Stopped 07/03/24 1528)  piperacillin -tazobactam (ZOSYN ) IVPB 3.375 g (0 g Intravenous Stopped 07/03/24 1328)  iohexol  (OMNIPAQUE ) 350 MG/ML injection 75 mL (75 mLs Intravenous Contrast Given 07/03/24 1341)    Clinical Course as of 07/03/24 1609  Tue Jul 03, 2024  1537 Personally evaluated patient, patient reportedly was found down, and altered by family.  Patient has had improving mental status since being in the emergency department.  Mental status has improved since being in the emergency department, on my evaluation at approximately 330, family feels that patient is back to baseline. Ethanol elevated, patient reportedly received moonshine as a Christmas gift, however denies ingesting alcohol. [LS]    Clinical Course  User Index [LS] Rogelia Jerilynn RAMAN, MD                                 Medical Decision Making Amount and/or Complexity of Data Reviewed Labs: ordered. Radiology: ordered.  Risk Prescription drug management. Decision regarding hospitalization.   Patient presents to the ED for concern of fall, altered mental status, this involves an extensive number of treatment options, and is a complaint that carries with it a high risk of complications and morbidity.  The differential diagnosis includes sepsis, drug use, alcohol use, medication side effect, dehydration, mechanical fall, head injury, infection, electrolyte abnormality, cardiac etiology for syncope, etc.   Co morbidities that complicate the patient evaluation  hypertension, hyperlipidemia, BPH, spinal stenosis, degenerative disc disease   Additional history obtained:  Additional  history obtained from wife who is at bedside, also received additional history from sons who are at bedside throughout encounter, when ethanol level came back increased, question patient who continued to deny alcohol use, son states that whenever loading the patient into the vehicle earlier today he states that he did smell alcohol Reviewed urgent care note from today where patient was subsequently referred to the emergency department, this note indicates that the patient had 2 episodes of syncope and collapse this morning, initially patient was found on the floor by had his wife and then the second time after he got up out of a chair he ended up falling and breaking a lamp.   Lab Tests:  I Ordered, and personally interpreted labs.  The pertinent results include: CBC pending at this time, CMP significant for glucose of 113, creatinine of 1.58, i-STAT shows hemoglobin of 9.9, creatinine 1.7, unremarkable CBG and PT/INR, initial lactic elevated at 2, urinalysis not consistent with infection, blood cultures pending, UDS negative, ethanol level 127   Imaging  Studies ordered:  I ordered imaging studies including CT imaging of chest abdomen pelvis, CT cervical spine, chest x-ray I independently visualized and interpreted imaging which showed: CXR: Mild central vascular congestion, cardiomegaly, no acute focal consolidation CT cervical spine: No signs of acute trauma CT chest abdomen pelvis with contrast: Possible infectious or inflammatory bronchiolitis, mild cardiomegaly and a small pericardial effusion, no acute trauma of the chest abdomen or pelvis, 1.4 cm cystic lesion in the pancreatic tail I agree with the radiologist interpretation   Cardiac Monitoring:  The patient was maintained on a cardiac monitor.  I personally viewed and interpreted the cardiac monitored which showed an underlying rhythm of: Sinus rhythm   Medicines ordered and prescription drug management:  I ordered medication including vancomycin  and Zosyn  for possible sepsis Reevaluation of the patient after these medicines showed that the patient improved I have reviewed the patients home medicines and have made adjustments as needed   Test Considered:  None   Critical Interventions:  Sepsis workup   Problem List / ED Course:  89 year old male, vital signs initially significant for decreased temp at 95.6, presents to the emergency department from urgent care due to syncope and collapse, unknown down time, no blood thinner On physical exam patient is alert and oriented however per wife has had episodes of altered mental status this morning, appears to be neurologically intact now, no apparent injuries from fall other than small hematoma to back of head not actively bleeding, no other apparent orthopedic injuries Due to abnormal temperature, unknown downtime and unknown reason for syncope and collapse will initiate large workup as well as imaging Chest x-ray, CT cervical spine reassuring CT chest abdomen pelvis with contrast as well as CT angio head and neck pending at  time of my sign-out Lab work significant for creatinine of 1.58, initial lactic acid of 2, urinalysis not consistent with infection, elevated ethanol level at 127 When questioned about ethanol use patient continues to deny, will redraw all, family members however suspicious for intake of moonshine, 1 son states that he felt like he smelled alcohol on the patient earlier today Will provide fluids due to increased lactic Patient signed out to oncoming PA Leita Chancy pending further work-up, findings, and ultimately disposition  At this time plan is for further information from CT angio head and neck, CT chest abdomen pelvis, as well as lab work, will also repeat ethanol Most likely diagnosis at this time however is alcohol  use leading to fall, less likely sepsis but patient covered with broad spectrum abx Patient has markedly improved since triage including altered mental status per family, per family patient is now back at baseline, vital signs have also improved including normal temperature of 97.8   Reevaluation:  After the interventions noted above, I reevaluated the patient and found that they have :improved   Social Determinants of Health:  none   Dispostion:  Patient signed out to oncoming PA Leita Chancy pending further work-up, findings, and ultimately disposition      Final diagnoses:  Hypothermia, initial encounter  Elevated lactic acid level  Pancreatic lesion  Fall, initial encounter  Alcoholic intoxication without complication    ED Discharge Orders     None          Janetta Terrall FAL[place], [place] 07/03/24 1610  "

## 2024-07-03 NOTE — ED Notes (Signed)
 Patient is being discharged from the Urgent Care and sent to the Emergency Department via POV . Per [place], [place], patient is in need of higher level of care due to syncope/fall, bradycardia. Patient is aware and verbalizes understanding of plan of care.  Vitals:   07/03/24 1120  BP: (!) 147/61  Pulse: (!) 48  Resp: (!) 24  Temp: (!) 95.5 F (35.3 C)  SpO2: 91%

## 2024-07-03 NOTE — Assessment & Plan Note (Signed)
 Ethanol on arrival 127>84 Likely contributing to fall  CIWA protocol

## 2024-07-03 NOTE — Assessment & Plan Note (Signed)
 Incidental finding on CT abdomen.  1.4cm cystic lesion in pancreatic tail  Consider outpatient MRI if appropriate

## 2024-07-03 NOTE — Assessment & Plan Note (Signed)
 Repeat UA in AM F/u outpatient

## 2024-07-04 ENCOUNTER — Other Ambulatory Visit: Payer: Self-pay | Admitting: Cardiology

## 2024-07-04 ENCOUNTER — Encounter: Payer: Self-pay | Admitting: *Deleted

## 2024-07-04 ENCOUNTER — Observation Stay

## 2024-07-04 ENCOUNTER — Observation Stay (HOSPITAL_COMMUNITY)

## 2024-07-04 DIAGNOSIS — R55 Syncope and collapse: Secondary | ICD-10-CM

## 2024-07-04 DIAGNOSIS — R9431 Abnormal electrocardiogram [ECG] [EKG]: Secondary | ICD-10-CM | POA: Diagnosis not present

## 2024-07-04 LAB — BASIC METABOLIC PANEL WITH GFR
Anion gap: 12 (ref 5–15)
BUN: 16 mg/dL (ref 8–23)
CO2: 22 mmol/L (ref 22–32)
Calcium: 9.3 mg/dL (ref 8.9–10.3)
Chloride: 107 mmol/L (ref 98–111)
Creatinine, Ser: 1.53 mg/dL — ABNORMAL HIGH (ref 0.61–1.24)
GFR, Estimated: 43 mL/min — ABNORMAL LOW
Glucose, Bld: 89 mg/dL (ref 70–99)
Potassium: 4.2 mmol/L (ref 3.5–5.1)
Sodium: 141 mmol/L (ref 135–145)

## 2024-07-04 LAB — ECHOCARDIOGRAM COMPLETE
AR max vel: 3.81 cm2
AV Area VTI: 3.9 cm2
AV Area mean vel: 3.76 cm2
AV Mean grad: 4 mmHg
AV Peak grad: 6.8 mmHg
Ao pk vel: 1.3 m/s
Area-P 1/2: 2.95 cm2
Calc EF: 67.4 %
P 1/2 time: 381 ms
S' Lateral: 2.8 cm
Single Plane A2C EF: 68.2 %
Single Plane A4C EF: 66.1 %

## 2024-07-04 LAB — CBC
HCT: 32.7 % — ABNORMAL LOW (ref 39.0–52.0)
Hemoglobin: 10.8 g/dL — ABNORMAL LOW (ref 13.0–17.0)
MCH: 29.6 pg (ref 26.0–34.0)
MCHC: 33 g/dL (ref 30.0–36.0)
MCV: 89.6 fL (ref 80.0–100.0)
Platelets: 131 K/uL — ABNORMAL LOW (ref 150–400)
RBC: 3.65 MIL/uL — ABNORMAL LOW (ref 4.22–5.81)
RDW: 14.4 % (ref 11.5–15.5)
WBC: 5.9 K/uL (ref 4.0–10.5)
nRBC: 0 % (ref 0.0–0.2)

## 2024-07-04 LAB — PROCALCITONIN
Procalcitonin: <0.1 ng/mL
Procalcitonin: <0.1 ng/mL

## 2024-07-04 LAB — LACTIC ACID, PLASMA: Lactic Acid, Venous: 1.4 mmol/L (ref 0.5–1.9)

## 2024-07-04 LAB — TSH: TSH: 2.9 u[IU]/mL (ref 0.350–4.500)

## 2024-07-04 NOTE — Plan of Care (Signed)

## 2024-07-04 NOTE — Progress Notes (Unsigned)
 Enrolled for [employer] to mail a ZIO AT Live Telemetry monitor to patients address on file.   Dr. Marlou Porch to read.

## 2024-07-04 NOTE — TOC Initial Note (Signed)
 Transition of Care [facility]) - Initial/Assessment Note    Patient Details  Name: Dennis Griffin MRN: [id number] Date of Birth: October 05, 1930  [facility]) CM/SW Contact:    Doneta Glenys DASEN, RN Phone Number: 07/04/2024, 4:46 PM  Clinical Narrative:                 Home:Refused resources: Todd(son) to transport at discharge. IP CM signing off  Expected Discharge Plan: Home/Self Care Barriers to Discharge: Barriers Resolved   Patient Goals and CMS Choice Patient states their goals for this hospitalization and ongoing recovery are:: Home CMS Medicare.gov Compare Post Acute Care list provided to:: Patient Choice offered to / list presented to : Patient [place] ownership interest in [facility].provided to:: Patient    Expected Discharge Plan and Services In-house Referral: NA Discharge Planning Services: NA Post Acute Care Choice: NA Living arrangements for the past 2 months: Single Family Home Expected Discharge Date: 07/04/24               DME Arranged: N/A DME Agency: NA       HH Arranged: NA HH Agency: NA        Prior Living Arrangements/Services Living arrangements for the past 2 months: Single Family Home Lives with:: Self Patient language and need for interpreter reviewed:: Yes Do you feel safe going back to the place where you live?: Yes      Need for Family Participation in Patient Care: No (Comment) Care giver support system in place?: Yes (comment) Current home services: DME (cane, walker,BSC,WC) Criminal Activity/Legal Involvement Pertinent to Current Situation/Hospitalization: No - Comment as needed  Activities of Daily Living      Permission Sought/Granted Permission sought to share information with : Case Manager Permission granted to share information with : Yes, Verbal Permission Granted  Share Information with NAME: Todd(son) [phone]           Emotional Assessment Appearance:: Appears stated  age Attitude/Demeanor/Rapport: Engaged Affect (typically observed): Appropriate Orientation: : Oriented to Self, Oriented to Place, Oriented to  Time, Oriented to Situation Alcohol / Substance Use: Alcohol Use Psych Involvement: No (comment)  Admission diagnosis:  Syncope and collapse [R55] Pancreatic lesion [K86.9] Elevated lactic acid level [R79.89] Hypothermia, initial encounter [T68.XXXA] Fall, initial encounter [id number]] Alcoholic intoxication without complication [F10.920] Occlusion of right internal carotid artery [I65.21] Anemia, unspecified type [D64.9] Hematuria, unspecified type [R31.9] Patient Active Problem List   Diagnosis Date Noted   Syncope and collapse 07/03/2024   Alcohol intoxication 07/03/2024   SIRS (systemic inflammatory response syndrome) (HCC) 07/03/2024   Stage 3b chronic kidney disease (HCC) 07/03/2024   Microscopic hematuria 07/03/2024   Pancreatic lesion 07/03/2024   Right carotid artery occlusion 07/03/2024   Hypothermia 07/03/2024   OA (osteoarthritis) of knee 03/30/2017   Neurogenic dysfunction of the urinary bladder 10/03/2015   Essential hypertension 06/02/2009   Hyperlipidemia 05/24/2009   Pancytopenia (HCC) 05/24/2009   TUBULOVILLOUS ADENOMA, 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   DIVERTICULOSIS, 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   PCP:  Yolande Toribio MATSU, MD Pharmacy:   [id number] [address] [place] - [address] [place] [address] Phone: [phone] Fax: [phone]     Social Drivers of Health (SDOH) Social History: SDOH Screenings   Food Insecurity: No Food Insecurity (07/03/2024)  Housing: Unknown (07/04/2024)  Transportation Needs: No Transportation Needs (07/03/2024)  Utilities: Not At Risk (07/03/2024)  Social Connections: Moderately Integrated (07/03/2024)  Tobacco Use: Medium Risk (07/03/2024)   SDOH Interventions:  Readmission Risk Interventions     No data to display

## 2024-07-04 NOTE — Plan of Care (Signed)

## 2024-07-04 NOTE — Consult Note (Addendum)
 "  Cardiology Consultation  Patient ID: Dennis Griffin MRN: [id number]; DOB: [birth year]  Admit date: 07/03/2024 Date of Consult: 07/04/2024  PCP:  Yolande Toribio MATSU, MD   [facility] Providers Cardiologist:  None     Patient Profile: Dennis Griffin is a 89 y.o. male with a hx of hypertension, hyperlipidemia, degenerative disc disease in his lumbar spine, BPH who is being seen 07/04/2024 for the evaluation of syncope and collapse at the request of Dr. Raenelle.  History of Present Illness: Dennis Griffin has past medical history as stated above.  He presented to [facility] urgent care on 1230 2:25 episodes of syncope and collapse this morning.  He reports that he was initially found on the floor by his wife, unsure how long he had been on the floor for but reports that she had witnessed him trying to get up.  She reports witnessing the second syncope and collapse, this was after he got out of the chair, falling, hitting his head, breaking a lamp.  Patient denies any recollection of these falls.  He was subsequently sent over to [facility] for further evaluation and treatment.  Upon presentation to the ER, afebrile, BP 138/85, HR 83, SpO2 93% on room air.  Relevant workup while in the ED includes: Metabolic panel shows elevated creatinine levels at 1.58 (previously at 1.3 however this is from 2018, may be new baseline), CBC shows stable chronic anemia with hemoglob lactic acid initially elevated has cleared, in at 9-10 as well as stable thrombocytopenia, negative procalcitonin, magnesium normal, troponin mildly elevated at 25, no repeat values, CK normal, UDS negative, blood cultures drawn showing no growth, ethanol level elevated at 127.  CXR showed mild cardiomegaly, central vascular congestion.  CT C-spine showed no acute fracture or subluxation.  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  CT chest/abdomen/pelvis showed small ground glass densities in the right upper lobe, infection versus bronchiolitis, mild cardiomegaly, cyst incidentally found on the pancreatic tail.  Echocardiogram this admission showed: LVEF 70 to 75%, no regional wall motion abnormalities, mild LVH, G1 DD, normal RV systolic function, small pericardial effusion present, mild aortic regurgitation, dilated IVC.  EKG shows sinus bradycardia, HR 48, prolonged PR interval, PVC.  After speaking with the patient, he tells me that he actually did not have any syncopal episodes.  He denies ever passing out.  He tells me that he was just going to adjust his lamp and then his wife thought that he was passing out.  He tells me that he completely feels fine, denies any current symptoms.  He just wants to go home, very eager to leave.  We discussed a long-term monitor so we can ensure that there is no underlying conductive disease as he does have a first-degree AV block on most of his telemetry as of now.  While in the ER he had some frequent 1 to 2-second pauses however nothing longer than this.  He reports no symptoms since has been in the hospital.  He had a single 12 beat run of NSVT noted on telemetry again outside of this isolated event there are no significant abnormalities on telemetry.  He tells me that he would be willing to wear a monitor as long as it resulted in him being discharged from the hospital.  Past Medical History:  Diagnosis Date   BNC (bladder neck  contracture)    BPH (benign prostatic hypertrophy)    DDD (degenerative disc disease), lumbar    Frequency of urination    Hyperlipemia    Hypertension    Nocturia    PONV (postoperative nausea and vomiting) 1973   after ORIF rt arm   Spinal stenosis    Urgency of urination    Past Surgical History:  Procedure Laterality Date   BACK SURGERY     CATARACT EXTRACTION W/ INTRAOCULAR LENS  IMPLANT,  BILATERAL     EYE SURGERY     HEMIARTHROPLASTY SHOULDER FRACTURE  1996   RIGHT--  TRAMATIC INJURY/ AVASCULAR NECROSIS   JOINT REPLACEMENT     KNEE ARTHROSCOPY  2002   LEFT   LUMBAR FUSION  05-08-2009   L5 - S1   ORIF ARM FX  1973   PARTIAL KNEE ARTHROPLASTY Right 03/30/2017   Procedure: Right knee medial unicompartmental arthroplasty;  Surgeon: Melodi Lerner, MD;  Location: [facility];  Service: Orthopedics;  Laterality: Right;   TONSILLECTOMY  1942   TOTAL KNEE ARTHROPLASTY  09-04-2001   LEFT   TRANSURETHRAL RESECTION OF PROSTATE  26 YRS AGO   AND REMOVAL OF PROSTATIC STONE   TRANSURETHRAL RESECTION OF PROSTATE  09/20/2011   Procedure: TRANSURETHRAL RESECTION OF THE PROSTATE WITH GYRUS INSTRUMENTS;  Surgeon: Toribio Neysa Repine, MD;  Location: [facility];  Service: Urology;  Laterality: N/A;    Home Medications:  Prior to Admission medications  Medication Sig Start Date End Date Taking? Authorizing Provider  azelastine  (OPTIVAR ) 0.05 % ophthalmic solution Place 1 drop into both eyes 2 (two) times daily. 08/26/23  Yes Juleen Rush, PA-C  doxazosin  (CARDURA ) 8 MG tablet Take 4 mg by mouth daily.  01/14/11  Yes [provider]  simvastatin  (ZOCOR ) 40 MG tablet Take 20 mg by mouth daily.    Yes [provider]  azithromycin  (ZITHROMAX ) 250 MG tablet Take 1 tablet (250 mg total) by mouth daily. Take first 2 tablets together, then 1 every day until finished. Patient not taking: Reported on 07/03/2024 05/24/24   Mayer, Jodi R, NP  gabapentin  (NEURONTIN ) 600 MG tablet Take 300-600 mg by mouth daily as needed (depends on pain if takes 300-600 mg).  12/31/14   [provider]    Scheduled Meds:  doxazosin   4 mg Oral Daily   folic acid   1 mg Oral Daily   multivitamin with minerals  1 tablet Oral Daily   simvastatin   20 mg Oral Daily   thiamine   100 mg Oral Daily   Or   thiamine   100 mg Intravenous Daily   Continuous Infusions:  PRN  Meds: acetaminophen  **OR** acetaminophen , gabapentin , HYDROcodone -acetaminophen , ketotifen, LORazepam **OR** LORazepam, ondansetron  **OR** ondansetron  (ZOFRAN ) IV  Allergies:   Allergies[1]  Social History:   Social History   Socioeconomic History   Marital status: Married    Spouse name: Not on file   Number of children: Not on file   Years of education: Not on file   Highest education level: Not on file  Occupational History   Not on file  Tobacco Use   Smoking status: Former    Current packs/day: 0.00    Types: Cigarettes    Start date: 07/23/1949    Quit date: 07/23/1964    Years since quitting: 59.9   Smokeless tobacco: Never  Vaping Use   Vaping status: Never Used  Substance and Sexual Activity   Alcohol use: No   Drug use: No   Sexual  activity: Not on file  Other Topics Concern   Not on file  Social History Narrative   Not on file   Social Drivers of Health   Tobacco Use: Medium Risk (07/03/2024)   Patient History    Smoking Tobacco Use: Former    Smokeless Tobacco Use: Never    Passive Exposure: Not on Actuary Strain: Not on file  Food Insecurity: No Food Insecurity (07/03/2024)   Epic    Worried About Programme Researcher, Broadcasting/film/video in the Last Year: Never true    Ran Out of Food in the Last Year: Never true  Transportation Needs: No Transportation Needs (07/03/2024)   Epic    Lack of Transportation (Medical): No    Lack of Transportation (Non-Medical): No  Physical Activity: Not on file  Stress: Not on file  Social Connections: Moderately Integrated (07/03/2024)   Social Connection and Isolation Panel    Frequency of Communication with Friends and Family: Once a week    Frequency of Social Gatherings with Friends and Family: Once a week    Attends Religious Services: 1 to 4 times per year    Active Member of [employer] or Organizations: Yes    Attends Banker Meetings: More than 4 times per year    Marital Status: Married  Careers Information Officer Violence: Not At Risk (07/03/2024)   Epic    Fear of Current or Ex-Partner: No    Emotionally Abused: No    Physically Abused: No    Sexually Abused: No  Depression (PHQ2-9): Not on file  Alcohol Screen: Not on file  Housing: Unknown (07/04/2024)   Epic    Unable to Pay for Housing in the Last Year: No    Number of Times Moved in the Last Year: 0    Homeless in the Last Year: Not on file  Utilities: Not At Risk (07/03/2024)   Epic    Threatened with loss of utilities: No  Health Literacy: Not on file    Family History:   History reviewed. No pertinent family history.   ROS:  Please see the history of present illness.  All other ROS reviewed and negative.     Physical Exam/Data: Vitals:   07/04/24 0221 07/04/24 0702 07/04/24 1045 07/04/24 1250  BP: (!) 180/73 (!) 187/81 (!) 187/80   Pulse: 75 86 72   Resp:  18  14  Temp: 98.3 F (36.8 C) 98.3 F (36.8 C) 98.2 F (36.8 C)   TempSrc: Oral     SpO2: 96% 93% 97%     Intake/Output Summary (Last 24 hours) at 07/04/2024 1332 Last data filed at 07/04/2024 1116 Gross per 24 hour  Intake 1000 ml  Output 400 ml  Net 600 ml      07/04/2023   10:45 AM 11/03/2020   10:12 AM 03/30/2017    2:54 PM  Last 3 Weights  Weight (lbs) 170 lb 170 lb 171 lb  Weight (kg) 77.111 kg 77.111 kg 77.565 kg     There is no height or weight on file to calculate BMI.   General:  elderly, in no acute distress HEENT: normal Neck: no JVD Vascular: No carotid bruits; Distal pulses 2+ bilaterally Cardiac:  normal S1, S2; RRR; no murmur  Lungs:  clear to auscultation bilaterally, no wheezing, rhonchi or rales  Abd: soft, nontender, no hepatomegaly  Ext: no edema Musculoskeletal:  No deformities, BUE and BLE strength normal and equal Skin: warm and dry  Neuro:  no focal abnormalities noted Psych:  Normal affect   EKG:  The EKG was personally reviewed and demonstrates: Sinus bradycardia, prolonged PR interval  Telemetry:  Telemetry  was personally reviewed and demonstrates: Sinus rhythm, HR 60s to 70s, prolonged PR interval consistent with first-degree AV block.  Telemetry while in the ER shows some 1 to 2-second pauses, nothing sustained, patient asymptomatic.  Single episode of NSVT x 12 beats, patient asymptomatic.  Relevant CV Studies:  Echocardiogram, 07/04/2024 Left ventricular ejection fraction, by estimation, is 70 to 75% . The left ventricle has hyperdynamic function. The left ventricle has no regional wall motion abnormalities. There is mild left ventricular hypertrophy. Left ventricular diastolic parameters are consistent with Grade I diastolic dysfunction ( impaired relaxation) .  Right ventricular systolic function is normal. The right ventricular size is normal. Tricuspid regurgitation signal is inadequate for assessing PA pressure. The estimated right ventricular systolic pressure is 18. 9 mmHg.  A small pericardial effusion is present.  The mitral valve is normal in structure. Trivial mitral valve regurgitation. No evidence of mitral stenosis.  The aortic valve is tricuspid. Aortic valve regurgitation is mild. Aortic valve sclerosis/ calcification is present, without any evidence of aortic stenosis.  The inferior vena cava is dilated in size with > 50% respiratory variability, suggesting right atrial pressure of 8 mmHg.  Laboratory Data: High Sensitivity Troponin:  No results for input(s): TROPONINIHS in the last 720 hours.  Recent Labs  Lab 07/03/24 1903  TRNPT 25*      Chemistry Recent Labs  Lab 07/03/24 1238 07/03/24 1331 07/03/24 1903 07/04/24 0638  NA 138 139  --  141  K 4.0 3.9  --  4.2  CL 104 106  --  107  CO2 22  --   --  22  GLUCOSE 113* 104*  --  89  BUN 17 17  --  16  CREATININE 1.58* 1.70*  --  1.53*  CALCIUM 9.4  --   --  9.3  MG  --   --  2.3  --   GFRNONAA 41*  --   --  43*  ANIONGAP 12  --   --  12    Recent Labs  Lab 07/03/24 1238  PROT 6.7  ALBUMIN  4.3  AST 23  ALT  11  ALKPHOS 76  BILITOT 0.4   Lipids No results for input(s): CHOL, TRIG, HDL, LABVLDL, LDLCALC, CHOLHDL in the last 168 hours.  Hematology Recent Labs  Lab 07/03/24 1331 07/03/24 1552 07/04/24 0638  WBC  --  3.8* 5.9  RBC  --  3.13* 3.65*  HGB 9.9* 9.3* 10.8*  HCT 29.0* 28.9* 32.7*  MCV  --  92.3 89.6  MCH  --  29.7 29.6  MCHC  --  32.2 33.0  RDW  --  14.5 14.4  PLT  --  116* 131*   Thyroid No results for input(s): TSH, FREET4 in the last 168 hours.  BNPNo results for input(s): BNP, PROBNP in the last 168 hours.  DDimer No results for input(s): DDIMER in the last 168 hours.  Radiology/Studies:  ECHOCARDIOGRAM COMPLETE Result Date: 07/04/2024    ECHOCARDIOGRAM REPORT   Patient Name:   Dennis Griffin Date of Exam: 07/04/2024 Medical Rec #:  [id number]       Height:       67.0 in Accession #:    [id number]      Weight:       170.0 lb Date of Birth:  05-20-1931  BSA:          1.887 m Patient Age:    89 years        BP:           187/81 mmHg Patient Gender: M               HR:           66 bpm. Exam Location:  Inpatient Procedure: 2D Echo, Cardiac Doppler and Color Doppler (Both Spectral and Color            Flow Doppler were utilized during procedure). Indications:    R55 Syncope  History:        Patient has no prior history of Echocardiogram examinations.                 Signs/Symptoms:Syncope; Risk Factors:Hypertension and                 Dyslipidemia. ETOH.  Sonographer:    Ellouise Mose RDCS Referring Phys: [id number] ALLISON WOLFE IMPRESSIONS  1. Left ventricular ejection fraction, by estimation, is 70 to 75%. The left ventricle has hyperdynamic function. The left ventricle has no regional wall motion abnormalities. There is mild left ventricular hypertrophy. Left ventricular diastolic parameters are consistent with Grade I diastolic dysfunction (impaired relaxation).  2. Right ventricular systolic function is normal. The right ventricular size is normal. Tricuspid  regurgitation signal is inadequate for assessing PA pressure. The estimated right ventricular systolic pressure is 18.9 mmHg.  3. A small pericardial effusion is present.  4. The mitral valve is normal in structure. Trivial mitral valve regurgitation. No evidence of mitral stenosis.  5. The aortic valve is tricuspid. Aortic valve regurgitation is mild. Aortic valve sclerosis/calcification is present, without any evidence of aortic stenosis.  6. The inferior vena cava is dilated in size with >50% respiratory variability, suggesting right atrial pressure of 8 mmHg. Comparison(s): No prior Echocardiogram. FINDINGS  Left Ventricle: Left ventricular ejection fraction, by estimation, is 70 to 75%. The left ventricle has hyperdynamic function. The left ventricle has no regional wall motion abnormalities. The left ventricular internal cavity size was normal in size. There is mild left ventricular hypertrophy. Left ventricular diastolic parameters are consistent with Grade I diastolic dysfunction (impaired relaxation). Right Ventricle: The right ventricular size is normal. Right ventricular systolic function is normal. Tricuspid regurgitation signal is inadequate for assessing PA pressure. The tricuspid regurgitant velocity is 1.65 m/s, and with an assumed right atrial  pressure of 8 mmHg, the estimated right ventricular systolic pressure is 18.9 mmHg. Left Atrium: Left atrial size was normal in size. Right Atrium: Right atrial size was normal in size. Pericardium: A small pericardial effusion is present. Mitral Valve: The mitral valve is normal in structure. Trivial mitral valve regurgitation. No evidence of mitral valve stenosis. Tricuspid Valve: The tricuspid valve is normal in structure. Tricuspid valve regurgitation is trivial. No evidence of tricuspid stenosis. Aortic Valve: The aortic valve is tricuspid. Aortic valve regurgitation is mild. Aortic regurgitation PHT measures 381 msec. Aortic valve  sclerosis/calcification is present, without any evidence of aortic stenosis. Aortic valve mean gradient measures 4.0  mmHg. Aortic valve peak gradient measures 6.8 mmHg. Aortic valve area, by VTI measures 3.90 cm. Pulmonic Valve: The pulmonic valve was normal in structure. Pulmonic valve regurgitation is not visualized. No evidence of pulmonic stenosis. Aorta: The aortic root is normal in size and structure. Venous: The inferior vena cava is dilated in size with greater than 50% respiratory variability, suggesting  right atrial pressure of 8 mmHg. IAS/Shunts: No atrial level shunt detected by color flow Doppler.  LEFT VENTRICLE PLAX 2D LVIDd:         5.10 cm     Diastology LVIDs:         2.80 cm     LV e' medial:    4.13 cm/s LV PW:         1.20 cm     LV E/e' medial:  19.5 LV IVS:        1.10 cm     LV e' lateral:   5.11 cm/s LVOT diam:     2.40 cm     LV E/e' lateral: 15.8 LV SV:         112 LV SV Index:   60 LVOT Area:     4.52 cm  LV Volumes (MOD) LV vol d, MOD A2C: 67.9 ml LV vol d, MOD A4C: 72.5 ml LV vol s, MOD A2C: 21.6 ml LV vol s, MOD A4C: 24.6 ml LV SV MOD A2C:     46.3 ml LV SV MOD A4C:     72.5 ml LV SV MOD BP:      48.0 ml RIGHT VENTRICLE             IVC RV S prime:     11.23 cm/s  IVC diam: 2.60 cm TAPSE (M-mode): 2.3 cm LEFT ATRIUM             Index        RIGHT ATRIUM           Index LA diam:        4.60 cm 2.44 cm/m   RA Area:     13.30 cm LA Vol (A2C):   50.2 ml 26.60 ml/m  RA Volume:   26.40 ml  13.99 ml/m LA Vol (A4C):   36.7 ml 19.45 ml/m LA Biplane Vol: 47.2 ml 25.01 ml/m  AORTIC VALVE AV Area (Vmax):    3.81 cm AV Area (Vmean):   3.76 cm AV Area (VTI):     3.90 cm AV Vmax:           130.00 cm/s AV Vmean:          89.300 cm/s AV VTI:            0.288 m AV Peak Grad:      6.8 mmHg AV Mean Grad:      4.0 mmHg LVOT Vmax:         109.50 cm/s LVOT Vmean:        74.150 cm/s LVOT VTI:          0.248 m LVOT/AV VTI ratio: 0.86 AI PHT:            381 msec  AORTA Ao Root diam: 3.75 cm Ao Asc  diam:  3.75 cm MITRAL VALVE                TRICUSPID VALVE MV Area (PHT): 2.95 cm     TR Peak grad:   10.9 mmHg MV Decel Time: 257 msec     TR Vmax:        165.00 cm/s MV E velocity: 80.50 cm/s MV A velocity: 120.00 cm/s  SHUNTS MV E/A ratio:  0.67         Systemic VTI:  0.25 m  Systemic Diam: 2.40 cm Redell Shallow MD Electronically signed by Redell Shallow MD Signature Date/Time: 07/04/2024/9:50:42 AM    Final    CT ANGIO HEAD NECK W WO CM Result Date: 07/03/2024 CLINICAL DATA:  Altered baseline mental status EXAM: CT ANGIOGRAPHY HEAD AND NECK WITH AND WITHOUT CONTRAST TECHNIQUE: Multidetector CT imaging of the head and neck was performed using the standard protocol during bolus administration of intravenous contrast. Multiplanar CT image reconstructions and MIPs were obtained to evaluate the vascular anatomy. Carotid stenosis measurements (when applicable) are obtained utilizing NASCET criteria, using the distal internal carotid diameter as the denominator. RADIATION DOSE REDUCTION: This exam was performed according to the departmental dose-optimization program which includes automated exposure control, adjustment of the mA and/or kV according to patient size and/or use of iterative reconstruction technique. CONTRAST:  75mL OMNIPAQUE  IOHEXOL  350 MG/ML SOLN COMPARISON:  None Available. FINDINGS: CT HEAD: The brain parenchyma appears normal. There is no hemorrhage. No acute ischemic changes. No mass lesion. The ventricles are normal. Skull/sinuses/orbits: Chronic right maxillary sinus dizzy CTA NECK: CTA NECK Aortic arch: There is plaque without proximal vessel stenosis Right carotid: The internal carotid artery is occluded at its origin. Left carotid: There is plaque in the left common carotid bifurcation without significant stenosis of the internal carotid artery Right vertebral: Dominant, patent without significant stenosis Left vertebral: Non dominant, patent without significant  stenosis Soft tissues: No significant abnormality Other comments: None CTA HEAD: CTA HEAD Right anterior circulation: The intracranial circulation is supplied by a patent anterior communicating artery, small right A1 segment and large right posterior communicating artery. The anterior and middle cerebral arteries are patent without proximal branch occlusion. Left anterior circulation: The internal carotid artery is patent without significant stenosis. The anterior and middle cerebral arteries are patent without significant stenosis or proximal branch occlusion. No aneurysm. Posterior circulation: Both vertebral arteries are patent. There is no significant basilar stenosis. Both posterior cerebral arteries are patent without significant stenosis or proximal branch occlusion. No aneurysm. IMPRESSION: 1. 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 No left carotid stenosis 3. No large vessel intracranial occlusion 4. No acute abnormality on the head CT Electronically Signed   By: Nancyann Burns M.D.   On: 07/03/2024 15:27   CT CHEST ABDOMEN PELVIS W CONTRAST Result Date: 07/03/2024 EXAM: CT CHEST, ABDOMEN AND PELVIS WITH CONTRAST 07/03/2024 02:10:02 PM TECHNIQUE: CT of the chest, abdomen and pelvis was performed with the administration of 75 mL iohexol  (OMNIPAQUE ) 350 MG/ML injection. Multiplanar reformatted images are provided for review. Automated exposure control, iterative reconstruction, and/or weight based adjustment of the mA/kV was utilized to reduce the radiation dose to as low as reasonably achievable. COMPARISON: None available. CLINICAL HISTORY: fall, altered mental status FINDINGS: CHEST: MEDIASTINUM AND LYMPH NODES: Mild cardiomegaly. Small pericardial effusion. Aortic atherosclerosis. The central airways are clear. No mediastinal, hilar or axillary lymphadenopathy. LUNGS AND PLEURA: Small clustered ground-glass nodular densities in the posterior right upper lobe, likely  infectious or inflammatory bronchiolitis. No pulmonary edema. Calcified pleural plaques bilaterally. Trace right pleural effusion. No pneumothorax. ABDOMEN AND PELVIS: LIVER: Benign appearing 1.4 cm cyst in the left hepatic lobe. GALLBLADDER AND BILE DUCTS: Gallbladder is unremarkable. No biliary ductal dilatation. SPLEEN: No acute abnormality. PANCREAS: 1.4 cm cystic area in the pancreatic tail. ADRENAL GLANDS: No acute abnormality. KIDNEYS, URETERS AND BLADDER: Bilateral simple appearing renal cysts. Per consensus, no follow-up is needed for simple Bosniak type 1 and 2 renal cysts, unless the patient has  a malignancy history or risk factors. Bilateral perinephric stranding. No stones in the kidneys or ureters. No hydronephrosis. No periureteral stranding. Urinary bladder is unremarkable. GI AND BOWEL: Stomach demonstrates no acute abnormality. Colonic diverticulosis. Normal appendix. There is no bowel obstruction. REPRODUCTIVE ORGANS: No acute abnormality. PERITONEUM AND RETROPERITONEUM: No ascites. No free air. VASCULATURE: Aorta is normal in caliber. ABDOMINAL AND PELVIS LYMPH NODES: No lymphadenopathy. BONES AND SOFT TISSUES: Postoperative changes in the lumbar spine. No acute osseous abnormality. No focal soft tissue abnormality. IMPRESSION: 1. Small clustered ground-glass nodular densities in the posterior right upper lobe, likely infectious or inflammatory bronchiolitis. 2. Mild cardiomegaly and small pericardial effusion. 3. No acute traumatic injury in the chest, abdomen, and pelvis. 4. 1.4 cm cystic lesion in the pancreatic tail . If felt clinically indicated, this could be further evaluated with nonemergent outpatient MRI. Electronically signed by: Franky Crease MD 07/03/2024 03:13 PM EST RP Workstation: [id number]   CT Cervical Spine Wo Contrast Result Date: 07/03/2024 EXAM: CT CERVICAL SPINE WITH CONTRAST 07/03/2024 02:10:02 PM TECHNIQUE: CT of the cervical spine was performed with the administration  of intravenous contrast. Multiplanar reformatted images are provided for review. Automated exposure control, iterative reconstruction, and/or weight based adjustment of the mA/kV was utilized to reduce the radiation dose to as low as reasonably achievable. COMPARISON: None available. CLINICAL HISTORY: FALL FINDINGS: BONES AND ALIGNMENT: No acute fracture. No acute posttraumatic malalignment. 3 mm retrolisthesis of C4 on C5, which is most likely due to chronic spondylosis. . DEGENERATIVE CHANGES: Multilevel disc space narrowing and endplate spurring are noted at C4-C5, C5-C6, and C6-C7. SOFT TISSUES: No prevertebral soft tissue swelling. VASCULATURE: Bilateral carotid artery calcifications. IMPRESSION: 1. No signs of acute cervical spine fracture or subluxation. 2. Multilevel cervical degenerative disc disease. Electronically signed by: Waddell Calk MD 07/03/2024 03:09 PM EST RP Workstation: [id number]   DG Chest Portable 1 View Result Date: 07/03/2024 CLINICAL DATA:  Questionable sepsis. EXAM: PORTABLE CHEST 1 VIEW COMPARISON:  Chest radiograph dated 07/04/2023. FINDINGS: Mild cardiomegaly with mild central vascular congestion. No focal consolidation, pleural effusion, or pneumothorax. Right shoulder arthroplasty. No acute osseous pathology. IMPRESSION: Mild cardiomegaly with mild central vascular congestion. No focal consolidation. Electronically Signed   By: Vanetta Chou M.D.   On: 07/03/2024 14:36   Assessment and Plan:  Syncope  Sustained 2 syncopal episodes, no prodrome EKG shows sinus bradycardia with prolonged PR interval Ethanol level 127 on arrival Electrolytes normal, pending TSH Echo showed, LVEF 70-75%, G1 DD, normal RV systolic function, no significant valvular abnormalities, dilated IVC Telemetry shows primarily sinus rhythm, prolonged PR, frequent ectopy, one episode of NSVT x 12 beats, no significant pauses noted   Not presently on any AV nodal blocking agents Takes doxazosin  for  BPH  Rates have improved to 70-80s No charted orthostatic vitals signs documented  Patient in the room by himself, denies any history of any syncope Tells me that he was doing fine, no prior symptoms Consider outpatient Live ZIO monitor x 2 weeks to assess for recurrent symptoms, patient stated he would be willing to wear this monitor to assess for underlying conductive disease  Per primary SIRS Alcohol intoxication  Pancytopenia  Right carotid artery stenosis  AKI vs CKD  Risk Assessment/Risk Scores:        For questions or updates, please contact [facility] Please consult [email or web address] for contact info under    Signed, Waddell DELENA Donath, PA-C  07/04/2024 1:32 PM  Personally seen and examined. Agree with above.  89 year old with syncope.  He was found on his floor by his wife.  Unsure how long.  This was a second episode.  Afebrile, creatinine 1.6 mildly elevated for him, troponin 25 flat ethanol level elevated at 127.  Echo EF 75%  ECG showed sinus bradycardia rate 48 with first-degree AV block.  Upon questioning, he does not feel like he had syncope.  Denies passing out he said he was just going to just the lamp when his wife thought he was passing out.  He keeps minimizing his symptoms.  Thankfully on telemetry we do not see any pauses.  He did have a 12 beat run of NSVT outside of this incident but this was asymptomatic.  In workup of his syncope or possible loss of consciousness, he would be willing to wear a monitor for 2 weeks.  May have been alcohol related as well.  Continue to monitor on telemetry.  Dennis Parchment, MD     [1]  Allergies Allergen Reactions   Penicillins Rash    Has patient had a PCN reaction causing immediate rash, facial/tongue/throat swelling, SOB or lightheadedness with hypotension: Yes Has patient had a PCN reaction causing severe rash involving mucus membranes or skin necrosis: No Has patient had a PCN reaction that  required hospitalization: Yes Has patient had a PCN reaction occurring within the last 10 years: No If all of the above answers are NO, then may proceed with Cephalosporin use.    "

## 2024-07-04 NOTE — Progress Notes (Signed)
" °  Echocardiogram 2D Echocardiogram has been performed.  Dennis Griffin 07/04/2024, 9:20 AM "

## 2024-07-04 NOTE — Plan of Care (Signed)
" °  Problem: Education: Goal: Knowledge of General Education information will improve Description: Including pain rating scale, medication(s)/side effects and non-pharmacologic comfort measures 07/04/2024 1450 by Aryeh Butterfield C, RN Outcome: Adequate for Discharge 07/04/2024 0838 by Lj Miyamoto C, RN Outcome: Progressing   Problem: Health Behavior/Discharge Planning: Goal: Ability to manage health-related needs will improve 07/04/2024 1450 by Rhett Mutschler C, RN Outcome: Adequate for Discharge 07/04/2024 0838 by Averey Trompeter C, RN Outcome: Progressing   Problem: Clinical Measurements: Goal: Ability to maintain clinical measurements within normal limits will improve 07/04/2024 1450 by Breya Cass C, RN Outcome: Adequate for Discharge 07/04/2024 9161 by Sosha Shepherd C, RN Outcome: Progressing Goal: Will remain free from infection 07/04/2024 1450 by Annely Sliva C, RN Outcome: Adequate for Discharge 07/04/2024 9161 by Phelan Goers C, RN Outcome: Progressing Goal: Diagnostic test results will improve 07/04/2024 1450 by Alleene Stoy C, RN Outcome: Adequate for Discharge 07/04/2024 9161 by Ziya Coonrod C, RN Outcome: Progressing Goal: Respiratory complications will improve 07/04/2024 1450 by Legrande Hao C, RN Outcome: Adequate for Discharge 07/04/2024 9161 by Iyannah Blake C, RN Outcome: Progressing Goal: Cardiovascular complication will be avoided 07/04/2024 1450 by Malary Aylesworth C, RN Outcome: Adequate for Discharge 07/04/2024 0838 by Mariya Mottley C, RN Outcome: Progressing   Problem: Activity: Goal: Risk for activity intolerance will decrease 07/04/2024 1450 by Colen Eltzroth C, RN Outcome: Adequate for Discharge 07/04/2024 0838 by Gordon Carolyn BROCKS, RN Outcome: Progressing   Problem: Nutrition: Goal: Adequate nutrition will be maintained 07/04/2024 1450 by Makahla Kiser C, RN Outcome: Adequate for Discharge 07/04/2024 9161  by Gordon Carolyn BROCKS, RN Outcome: Progressing   Problem: Coping: Goal: Level of anxiety will decrease 07/04/2024 1450 by Jaidon Ellery C, RN Outcome: Adequate for Discharge 07/04/2024 0838 by Babygirl Trager C, RN Outcome: Progressing   Problem: Elimination: Goal: Will not experience complications related to bowel motility 07/04/2024 1450 by Fortino Haag C, RN Outcome: Adequate for Discharge 07/04/2024 9161 by Mykayla Brinton C, RN Outcome: Progressing Goal: Will not experience complications related to urinary retention 07/04/2024 1450 by Sequoya Hogsett C, RN Outcome: Adequate for Discharge 07/04/2024 0838 by Saim Almanza C, RN Outcome: Progressing   Problem: Pain Managment: Goal: General experience of comfort will improve and/or be controlled 07/04/2024 1450 by Breckan Cafiero C, RN Outcome: Adequate for Discharge 07/04/2024 0838 by Fatima Fedie C, RN Outcome: Progressing   Problem: Safety: Goal: Ability to remain free from injury will improve 07/04/2024 1450 by Mackinley Kiehn C, RN Outcome: Adequate for Discharge 07/04/2024 0838 by Meara Wiechman C, RN Outcome: Progressing   Problem: Skin Integrity: Goal: Risk for impaired skin integrity will decrease 07/04/2024 1450 by Rayvion Stumph C, RN Outcome: Adequate for Discharge 07/04/2024 0838 by Gayatri Teasdale C, RN Outcome: Progressing   "

## 2024-07-04 NOTE — Evaluation (Signed)
 Occupational Therapy Evaluation/Discharge Patient Details Name: Dennis Griffin MRN: [id number] DOB: 10/17/1930 Today's Date: 07/04/2024   History of Present Illness   89 year old male admitted 07/03/24 with a history of BPH, hyperlipidemia, hypertension presents to the ER, found down on the ground by his wife unknown length of time.  He was giddy and confused.  Wife took him to urgent care and was sent to the emergency room.  In the emergency room, normotensive.     Clinical Impressions Patient evaluated by Occupational Therapy with no further acute OT needs identified. All education has been completed and the patient has no further questions. No follow-up Occupational Therapy or equipment needs. OT is signing off. Thank you for this referral.       Functional Status Assessment   Patient has not had a recent decline in their functional status     Equipment Recommendations   None recommended by OT      Precautions/Restrictions   Precautions Precautions: Fall Recall of Precautions/Restrictions: Intact Restrictions Weight Bearing Restrictions Per Provider Order: No     Mobility Bed Mobility Overal bed mobility: Independent   Transfers Overall transfer level: Needs assistance Equipment used: None Transfers: Sit to/from Stand, Bed to chair/wheelchair/BSC Sit to Stand: Supervision     Step pivot transfers: Supervision     General transfer comment: Provided SBA for safety although did not require any physical assistance. Pt furniture/wall walks.      Balance Overall balance assessment: Mild deficits observed, not formally tested        ADL either performed or assessed with clinical judgement   ADL Overall ADL's : Independent;At baseline           Vision Baseline Vision/History: 1 Wears glasses Ability to See in Adequate Light: 0 Adequate Patient Visual Report: No change from baseline Vision Assessment?: No apparent visual deficits     Perception  Perception: Within Functional Limits       Praxis Praxis: WFL       Pertinent Vitals/Pain Pain Assessment Pain Assessment: No/denies pain     Extremity/Trunk Assessment Upper Extremity Assessment Upper Extremity Assessment: Overall WFL for tasks assessed   Lower Extremity Assessment Lower Extremity Assessment: Overall WFL for tasks assessed   Cervical / Trunk Assessment Cervical / Trunk Assessment: Other exceptions Cervical / Trunk Exceptions: history of back surgery   Communication Communication Communication: No apparent difficulties   Cognition Arousal: Alert Behavior During Therapy: Flat affect Cognition: No apparent impairments           Following commands: Intact       Cueing  General Comments   Cueing Techniques: Verbal cues  VSS on RA           Home Living Family/patient expects to be discharged to:: Private residence Living Arrangements: Spouse/significant other Available Help at Discharge: Family Type of Home: House Home Access: Level entry     Home Layout: One level     Bathroom Shower/Tub: Walk-in Human Resources Officer: Standard     Home Equipment: Agricultural Consultant (2 wheels);Cane - single point;BSC/3in1   Additional Comments: Pt normally furniture walks or uses SPC      Prior Functioning/Environment Prior Level of Function : Independent/Modified Independent;Driving    Mobility Comments: Uses SPC      OT Problem List: Decreased strength        OT Goals(Current goals can be found in the care plan section)   Acute Rehab OT Goals OT Goal Formulation: All assessment and education complete,  DC therapy   OT Frequency:   1 time visit       AM-PAC OT 6 Clicks Daily Activity     Outcome Measure Help from another person eating meals?: None Help from another person taking care of personal grooming?: None Help from another person toileting, which includes using toliet, bedpan, or urinal?: None Help from another person  bathing (including washing, rinsing, drying)?: None Help from another person to put on and taking off regular upper body clothing?: None Help from another person to put on and taking off regular lower body clothing?: None 6 Click Score: 24   End of Session Nurse Communication: Mobility status  Activity Tolerance: Patient tolerated treatment well Patient left: in chair;with call bell/phone within reach;with chair alarm set  OT Visit Diagnosis: Unsteadiness on feet (R26.81);Muscle weakness (generalized) (M62.81)                Time: 1212-1229 OT Time Calculation (min): 17 min Charges:  OT General Charges $OT Visit: 1 Visit OT Evaluation $OT Eval Low Complexity: 1 Low  Leita Howell, OTR/L,CBIS  Supplemental OT - [facility] and [facility] Secure Chat Preferred    Casara Perrier, Leita BIRCH 07/04/2024, 12:40 PM

## 2024-07-04 NOTE — Progress Notes (Signed)
 Ordering 2 week live Zio monitor in the setting of syncope. Results to Dr. Jeffrie

## 2024-07-04 NOTE — Discharge Summary (Signed)
 Physician Discharge Summary  Dennis Griffin FMW:[id number] DOB: 07/28/1930 DOA: 07/03/2024  PCP: Yolande Toribio MATSU, MD  Admit date: 07/03/2024 Discharge date: 07/04/2024  Admitted From: Home  Disposition:  Home   Recommendations for Outpatient Follow-up:  Follow up with PCP in 1-2 weeks  2, cardiology to schedule cardiac monitoring and follow up.   Home Health:NA  Equipment/Devices:NA   Discharge Condition:Stable   CODE STATUS:Full Code  Diet recommendation: Regular diet   Discharge Summary: 89 year old with history of BPH, HLD, HTN brought to ER found down on the ground and seemed confused. Medically stable on arrival to ER. Skeletal survey was negative.  Initial EKG showed sinus bradycardia, prolonged PR interval and PVCs.  Patient remained hemodynamically stable.  Overnight monitored in the telemonitoring without any significant bradycardia or junctional rhythm.  2D echocardiogram was essentially normal, grade 1 diastolic dysfunction.  Alcohol level was 127 on arrival.  Troponin is 25, CK normal, UDS negative.  Blood cultures negative.  Suspected syncope: First episode.  Likely multifactorial.  Suspect vasovagal.  However given significant EKG changes, recommended ambulatory cardiac monitoring for 2 weeks.  Hemodynamically stable today, going home with cardiology follow-up.  No evidence of orthostatics. CT angiogram the head and neck showed occluded right carotid, chronic. Negative for orthostatic.  Blood pressures elevated.  Resume doxazosin  Stable to discharge home with close outpatient follow-up.   Discharge Diagnoses:  Principal Problem:   Syncope and collapse Active Problems:   Hypothermia   SIRS (systemic inflammatory response syndrome) (HCC)   Pancytopenia (HCC)   Right carotid artery occlusion   Pancreatic lesion   Microscopic hematuria   Alcohol intoxication   Stage 3b chronic kidney disease (HCC)   Essential hypertension   Hyperlipidemia    Discharge  Instructions  Discharge Instructions     Diet - low sodium heart healthy   Complete by: As directed    Increase activity slowly   Complete by: As directed       Allergies as of 07/04/2024       Reactions   Penicillins Rash   Has patient had a PCN reaction causing immediate rash, facial/tongue/throat swelling, SOB or lightheadedness with hypotension: Yes Has patient had a PCN reaction causing severe rash involving mucus membranes or skin necrosis: No Has patient had a PCN reaction that required hospitalization: Yes Has patient had a PCN reaction occurring within the last 10 years: No If all of the above answers are NO, then may proceed with Cephalosporin use.        Medication List     STOP taking these medications    azithromycin  250 MG tablet Commonly known as: ZITHROMAX        TAKE these medications    azelastine  0.05 % ophthalmic solution Commonly known as: OPTIVAR  Place 1 drop into both eyes 2 (two) times daily.   doxazosin  8 MG tablet Commonly known as: CARDURA  Take 4 mg by mouth daily.   gabapentin  600 MG tablet Commonly known as: NEURONTIN  Take 300-600 mg by mouth daily as needed (depends on pain if takes 300-600 mg).   simvastatin  40 MG tablet Commonly known as: ZOCOR  Take 20 mg by mouth daily.        Allergies[1]  Consultations: Cardiology   Procedures/Studies: ECHOCARDIOGRAM COMPLETE Result Date: 07/04/2024    ECHOCARDIOGRAM REPORT   Patient Name:   Dennis Griffin Date of Exam: 07/04/2024 Medical Rec #:  [id number]       Height:       67.0  in Accession #:    [id number]      Weight:       170.0 lb Date of Birth:  Jan 24, 1931        BSA:          1.887 m Patient Age:    89 years        BP:           187/81 mmHg Patient Gender: M               HR:           66 bpm. Exam Location:  Inpatient Procedure: 2D Echo, Cardiac Doppler and Color Doppler (Both Spectral and Color            Flow Doppler were utilized during procedure). Indications:    R55  Syncope  History:        Patient has no prior history of Echocardiogram examinations.                 Signs/Symptoms:Syncope; Risk Factors:Hypertension and                 Dyslipidemia. ETOH.  Sonographer:    Ellouise Mose RDCS Referring Phys: [id number] ALLISON WOLFE IMPRESSIONS  1. Left ventricular ejection fraction, by estimation, is 70 to 75%. The left ventricle has hyperdynamic function. The left ventricle has no regional wall motion abnormalities. There is mild left ventricular hypertrophy. Left ventricular diastolic parameters are consistent with Grade I diastolic dysfunction (impaired relaxation).  2. Right ventricular systolic function is normal. The right ventricular size is normal. Tricuspid regurgitation signal is inadequate for assessing PA pressure. The estimated right ventricular systolic pressure is 18.9 mmHg.  3. A small pericardial effusion is present.  4. The mitral valve is normal in structure. Trivial mitral valve regurgitation. No evidence of mitral stenosis.  5. The aortic valve is tricuspid. Aortic valve regurgitation is mild. Aortic valve sclerosis/calcification is present, without any evidence of aortic stenosis.  6. The inferior vena cava is dilated in size with >50% respiratory variability, suggesting right atrial pressure of 8 mmHg. Comparison(s): No prior Echocardiogram. FINDINGS  Left Ventricle: Left ventricular ejection fraction, by estimation, is 70 to 75%. The left ventricle has hyperdynamic function. The left ventricle has no regional wall motion abnormalities. The left ventricular internal cavity size was normal in size. There is mild left ventricular hypertrophy. Left ventricular diastolic parameters are consistent with Grade I diastolic dysfunction (impaired relaxation). Right Ventricle: The right ventricular size is normal. Right ventricular systolic function is normal. Tricuspid regurgitation signal is inadequate for assessing PA pressure. The tricuspid regurgitant velocity is 1.65  m/s, and with an assumed right atrial  pressure of 8 mmHg, the estimated right ventricular systolic pressure is 18.9 mmHg. Left Atrium: Left atrial size was normal in size. Right Atrium: Right atrial size was normal in size. Pericardium: A small pericardial effusion is present. Mitral Valve: The mitral valve is normal in structure. Trivial mitral valve regurgitation. No evidence of mitral valve stenosis. Tricuspid Valve: The tricuspid valve is normal in structure. Tricuspid valve regurgitation is trivial. No evidence of tricuspid stenosis. Aortic Valve: The aortic valve is tricuspid. Aortic valve regurgitation is mild. Aortic regurgitation PHT measures 381 msec. Aortic valve sclerosis/calcification is present, without any evidence of aortic stenosis. Aortic valve mean gradient measures 4.0  mmHg. Aortic valve peak gradient measures 6.8 mmHg. Aortic valve area, by VTI measures 3.90 cm. Pulmonic Valve: The pulmonic valve was normal in structure. Pulmonic valve regurgitation is  not visualized. No evidence of pulmonic stenosis. Aorta: The aortic root is normal in size and structure. Venous: The inferior vena cava is dilated in size with greater than 50% respiratory variability, suggesting right atrial pressure of 8 mmHg. IAS/Shunts: No atrial level shunt detected by color flow Doppler.  LEFT VENTRICLE PLAX 2D LVIDd:         5.10 cm     Diastology LVIDs:         2.80 cm     LV e' medial:    4.13 cm/s LV PW:         1.20 cm     LV E/e' medial:  19.5 LV IVS:        1.10 cm     LV e' lateral:   5.11 cm/s LVOT diam:     2.40 cm     LV E/e' lateral: 15.8 LV SV:         112 LV SV Index:   60 LVOT Area:     4.52 cm  LV Volumes (MOD) LV vol d, MOD A2C: 67.9 ml LV vol d, MOD A4C: 72.5 ml LV vol s, MOD A2C: 21.6 ml LV vol s, MOD A4C: 24.6 ml LV SV MOD A2C:     46.3 ml LV SV MOD A4C:     72.5 ml LV SV MOD BP:      48.0 ml RIGHT VENTRICLE             IVC RV S prime:     11.23 cm/s  IVC diam: 2.60 cm TAPSE (M-mode): 2.3 cm LEFT  ATRIUM             Index        RIGHT ATRIUM           Index LA diam:        4.60 cm 2.44 cm/m   RA Area:     13.30 cm LA Vol (A2C):   50.2 ml 26.60 ml/m  RA Volume:   26.40 ml  13.99 ml/m LA Vol (A4C):   36.7 ml 19.45 ml/m LA Biplane Vol: 47.2 ml 25.01 ml/m  AORTIC VALVE AV Area (Vmax):    3.81 cm AV Area (Vmean):   3.76 cm AV Area (VTI):     3.90 cm AV Vmax:           130.00 cm/s AV Vmean:          89.300 cm/s AV VTI:            0.288 m AV Peak Grad:      6.8 mmHg AV Mean Grad:      4.0 mmHg LVOT Vmax:         109.50 cm/s LVOT Vmean:        74.150 cm/s LVOT VTI:          0.248 m LVOT/AV VTI ratio: 0.86 AI PHT:            381 msec  AORTA Ao Root diam: 3.75 cm Ao Asc diam:  3.75 cm MITRAL VALVE                TRICUSPID VALVE MV Area (PHT): 2.95 cm     TR Peak grad:   10.9 mmHg MV Decel Time: 257 msec     TR Vmax:        165.00 cm/s MV E velocity: 80.50 cm/s MV A velocity: 120.00 cm/s  SHUNTS MV E/A ratio:  0.67  Systemic VTI:  0.25 m                             Systemic Diam: 2.40 cm Redell Shallow MD Electronically signed by Redell Shallow MD Signature Date/Time: 07/04/2024/9:50:42 AM    Final    CT ANGIO HEAD NECK W WO CM Result Date: 07/03/2024 CLINICAL DATA:  Altered baseline mental status EXAM: CT ANGIOGRAPHY HEAD AND NECK WITH AND WITHOUT CONTRAST TECHNIQUE: Multidetector CT imaging of the head and neck was performed using the standard protocol during bolus administration of intravenous contrast. Multiplanar CT image reconstructions and MIPs were obtained to evaluate the vascular anatomy. Carotid stenosis measurements (when applicable) are obtained utilizing NASCET criteria, using the distal internal carotid diameter as the denominator. RADIATION DOSE REDUCTION: This exam was performed according to the departmental dose-optimization program which includes automated exposure control, adjustment of the mA and/or kV according to patient size and/or use of iterative reconstruction technique.  CONTRAST:  75mL OMNIPAQUE  IOHEXOL  350 MG/ML SOLN COMPARISON:  None Available. FINDINGS: CT HEAD: The brain parenchyma appears normal. There is no hemorrhage. No acute ischemic changes. No mass lesion. The ventricles are normal. Skull/sinuses/orbits: Chronic right maxillary sinus dizzy CTA NECK: CTA NECK Aortic arch: There is plaque without proximal vessel stenosis Right carotid: The internal carotid artery is occluded at its origin. Left carotid: There is plaque in the left common carotid bifurcation without significant stenosis of the internal carotid artery Right vertebral: Dominant, patent without significant stenosis Left vertebral: Non dominant, patent without significant stenosis Soft tissues: No significant abnormality Other comments: None CTA HEAD: CTA HEAD Right anterior circulation: The intracranial circulation is supplied by a patent anterior communicating artery, small right A1 segment and large right posterior communicating artery. The anterior and middle cerebral arteries are patent without proximal branch occlusion. Left anterior circulation: The internal carotid artery is patent without significant stenosis. The anterior and middle cerebral arteries are patent without significant stenosis or proximal branch occlusion. No aneurysm. Posterior circulation: Both vertebral arteries are patent. There is no significant basilar stenosis. Both posterior cerebral arteries are patent without significant stenosis or proximal branch occlusion. No aneurysm. IMPRESSION: 1. 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 No left carotid stenosis 3. No large vessel intracranial occlusion 4. No acute abnormality on the head CT Electronically Signed   By: Nancyann Burns M.D.   On: 07/03/2024 15:27   CT CHEST ABDOMEN PELVIS W CONTRAST Result Date: 07/03/2024 EXAM: CT CHEST, ABDOMEN AND PELVIS WITH CONTRAST 07/03/2024 02:10:02 PM TECHNIQUE: CT of the chest, abdomen and pelvis was performed  with the administration of 75 mL iohexol  (OMNIPAQUE ) 350 MG/ML injection. Multiplanar reformatted images are provided for review. Automated exposure control, iterative reconstruction, and/or weight based adjustment of the mA/kV was utilized to reduce the radiation dose to as low as reasonably achievable. COMPARISON: None available. CLINICAL HISTORY: fall, altered mental status FINDINGS: CHEST: MEDIASTINUM AND LYMPH NODES: Mild cardiomegaly. Small pericardial effusion. Aortic atherosclerosis. The central airways are clear. No mediastinal, hilar or axillary lymphadenopathy. LUNGS AND PLEURA: Small clustered ground-glass nodular densities in the posterior right upper lobe, likely infectious or inflammatory bronchiolitis. No pulmonary edema. Calcified pleural plaques bilaterally. Trace right pleural effusion. No pneumothorax. ABDOMEN AND PELVIS: LIVER: Benign appearing 1.4 cm cyst in the left hepatic lobe. GALLBLADDER AND BILE DUCTS: Gallbladder is unremarkable. No biliary ductal dilatation. SPLEEN: No acute abnormality. PANCREAS: 1.4 cm cystic area in the pancreatic tail.  ADRENAL GLANDS: No acute abnormality. KIDNEYS, URETERS AND BLADDER: Bilateral simple appearing renal cysts. Per consensus, no follow-up is needed for simple Bosniak type 1 and 2 renal cysts, unless the patient has a malignancy history or risk factors. Bilateral perinephric stranding. No stones in the kidneys or ureters. No hydronephrosis. No periureteral stranding. Urinary bladder is unremarkable. GI AND BOWEL: Stomach demonstrates no acute abnormality. Colonic diverticulosis. Normal appendix. There is no bowel obstruction. REPRODUCTIVE ORGANS: No acute abnormality. PERITONEUM AND RETROPERITONEUM: No ascites. No free air. VASCULATURE: Aorta is normal in caliber. ABDOMINAL AND PELVIS LYMPH NODES: No lymphadenopathy. BONES AND SOFT TISSUES: Postoperative changes in the lumbar spine. No acute osseous abnormality. No focal soft tissue abnormality.  IMPRESSION: 1. Small clustered ground-glass nodular densities in the posterior right upper lobe, likely infectious or inflammatory bronchiolitis. 2. Mild cardiomegaly and small pericardial effusion. 3. No acute traumatic injury in the chest, abdomen, and pelvis. 4. 1.4 cm cystic lesion in the pancreatic tail . If felt clinically indicated, this could be further evaluated with nonemergent outpatient MRI. Electronically signed by: Franky Crease MD 07/03/2024 03:13 PM EST RP Workstation: [id number]   CT Cervical Spine Wo Contrast Result Date: 07/03/2024 EXAM: CT CERVICAL SPINE WITH CONTRAST 07/03/2024 02:10:02 PM TECHNIQUE: CT of the cervical spine was performed with the administration of intravenous contrast. Multiplanar reformatted images are provided for review. Automated exposure control, iterative reconstruction, and/or weight based adjustment of the mA/kV was utilized to reduce the radiation dose to as low as reasonably achievable. COMPARISON: None available. CLINICAL HISTORY: FALL FINDINGS: BONES AND ALIGNMENT: No acute fracture. No acute posttraumatic malalignment. 3 mm retrolisthesis of C4 on C5, which is most likely due to chronic spondylosis. . DEGENERATIVE CHANGES: Multilevel disc space narrowing and endplate spurring are noted at C4-C5, C5-C6, and C6-C7. SOFT TISSUES: No prevertebral soft tissue swelling. VASCULATURE: Bilateral carotid artery calcifications. IMPRESSION: 1. No signs of acute cervical spine fracture or subluxation. 2. Multilevel cervical degenerative disc disease. Electronically signed by: Waddell Calk MD 07/03/2024 03:09 PM EST RP Workstation: [id number]   DG Chest Portable 1 View Result Date: 07/03/2024 CLINICAL DATA:  Questionable sepsis. EXAM: PORTABLE CHEST 1 VIEW COMPARISON:  Chest radiograph dated 07/04/2023. FINDINGS: Mild cardiomegaly with mild central vascular congestion. No focal consolidation, pleural effusion, or pneumothorax. Right shoulder arthroplasty. No acute osseous  pathology. IMPRESSION: Mild cardiomegaly with mild central vascular congestion. No focal consolidation. Electronically Signed   By: Vanetta Chou M.D.   On: 07/03/2024 14:36   (Echo, Carotid, EGD, Colonoscopy, ERCP)    Subjective: Patient seen in the morning rounds.  Denies any complaints.  He tells me he did not fall and never lost consciousness.  Patient is eager to go home.  Telemetry monitor shows mostly sinus rhythm.  Case discussed with cardiology to schedule ambulatory monitoring.   Discharge Exam: Vitals:   07/04/24 1045 07/04/24 1250  BP: (!) 187/80   Pulse: 72   Resp:  14  Temp: 98.2 F (36.8 C)   SpO2: 97%    Vitals:   07/04/24 0221 07/04/24 0702 07/04/24 1045 07/04/24 1250  BP: (!) 180/73 (!) 187/81 (!) 187/80   Pulse: 75 86 72   Resp:  18  14  Temp: 98.3 F (36.8 C) 98.3 F (36.8 C) 98.2 F (36.8 C)   TempSrc: Oral     SpO2: 96% 93% 97%     General: Pt is alert, awake, not in acute distress Cardiovascular: RRR, S1/S2 +, no rubs, no gallops Respiratory: CTA bilaterally, no wheezing,  no rhonchi Abdominal: Soft, NT, ND, bowel sounds + Extremities: no edema, no cyanosis    The results of significant diagnostics from this hospitalization (including imaging, microbiology, ancillary and laboratory) are listed below for reference.     Microbiology: Recent Results (from the past 240 hours)  Blood Culture (routine x 2)     Status: None (Preliminary result)   Collection Time: 07/03/24 12:38 PM   Specimen: BLOOD  Result Value Ref Range Status   Specimen Description   Final    BLOOD LEFT ANTECUBITAL Performed at [facility], [address], [place], [place] [address]    Special Requests   Final    BOTTLES DRAWN AEROBIC AND ANAEROBIC Blood Culture adequate volume Performed at [facility], [address], [place], [place] [address]    Culture   Final    NO GROWTH < 24 HOURS Performed at [facility], [address], [place], [place] [address]    Report Status PENDING  Incomplete  Blood Culture (routine x 2)     Status: None (Preliminary result)   Collection Time: 07/03/24 12:55 PM   Specimen: BLOOD LEFT HAND  Result Value Ref Range Status   Specimen Description   Final    BLOOD LEFT HAND Performed at [facility], [address], [place], [place] [address]    Special Requests   Final    BOTTLES DRAWN AEROBIC AND ANAEROBIC Blood Culture results may not be optimal due to an inadequate volume of blood received in culture bottles Performed at [facility], [address], [place], [place] [address]    Culture   Final    NO GROWTH < 24 HOURS Performed at [facility], [address] [place], [place] [address]    Report Status PENDING  Incomplete     Labs: BNP (last 3 results) No results for input(s): BNP in the last 8760 hours. Basic Metabolic Panel: Recent Labs  Lab 07/03/24 1238 07/03/24 1331 07/03/24 1903 07/04/24 0638  NA 138 139  --  141  K 4.0 3.9  --  4.2  CL 104 106  --  107  CO2 22  --   --  22  GLUCOSE 113* 104*  --  89  BUN 17 17  --  16  CREATININE 1.58* 1.70*  --  1.53*  CALCIUM 9.4  --   --  9.3  MG  --   --  2.3  --    Liver Function Tests: Recent Labs  Lab 07/03/24 1238  AST 23  ALT 11  ALKPHOS 76  BILITOT 0.4  PROT 6.7  ALBUMIN  4.3   No results for input(s): LIPASE, AMYLASE in the last 168 hours. No results for input(s): AMMONIA in the last 168 hours. CBC: Recent Labs  Lab 07/03/24 1331 07/03/24 1552 07/04/24 0638  WBC  --  3.8* 5.9  NEUTROABS  --  2.4  --   HGB 9.9* 9.3* 10.8*  HCT 29.0* 28.9* 32.7*  MCV  --  92.3 89.6  PLT  --  116* 131*   Cardiac Enzymes: Recent Labs  Lab 07/03/24 1553  CKTOTAL 148   BNP: Invalid input(s): POCBNP CBG: Recent Labs  Lab 07/03/24 1253  GLUCAP 97   D-Dimer No results for input(s): DDIMER in the last 72 hours. Hgb A1c No results for input(s): HGBA1C in  the last 72 hours. Lipid Profile No results for input(s): CHOL, HDL, LDLCALC, TRIG, CHOLHDL,  LDLDIRECT in the last 72 hours. Thyroid function studies Recent Labs    07/04/24 1305  TSH 2.900   Anemia work up Recent Labs    07/03/24 1903  VITAMINB12 324  FERRITIN 134  TIBC 295  IRON 65   Urinalysis    Component Value Date/Time   COLORURINE STRAW (A) 07/03/2024 1433   APPEARANCEUR CLEAR 07/03/2024 1433   LABSPEC 1.011 07/03/2024 1433   PHURINE 6.0 07/03/2024 1433   GLUCOSEU NEGATIVE 07/03/2024 1433   HGBUR SMALL (A) 07/03/2024 1433   BILIRUBINUR NEGATIVE 07/03/2024 1433   KETONESUR NEGATIVE 07/03/2024 1433   PROTEINUR NEGATIVE 07/03/2024 1433   NITRITE NEGATIVE 07/03/2024 1433   LEUKOCYTESUR NEGATIVE 07/03/2024 1433   Sepsis Labs Recent Labs  Lab 07/03/24 1552 07/04/24 0638  WBC 3.8* 5.9   Microbiology Recent Results (from the past 240 hours)  Blood Culture (routine x 2)     Status: None (Preliminary result)   Collection Time: 07/03/24 12:38 PM   Specimen: BLOOD  Result Value Ref Range Status   Specimen Description   Final    BLOOD LEFT ANTECUBITAL Performed at [facility], [address] [place], [place] [address]    Special Requests   Final    BOTTLES DRAWN AEROBIC AND ANAEROBIC Blood Culture adequate volume Performed at [facility], [address] [place], [place] [address]    Culture   Final    NO GROWTH < 24 HOURS Performed at [facility], [address] [place], [place] [address]    Report Status PENDING  Incomplete  Blood Culture (routine x 2)     Status: None (Preliminary result)   Collection Time: 07/03/24 12:55 PM   Specimen: BLOOD LEFT HAND  Result Value Ref Range Status   Specimen Description   Final    BLOOD LEFT HAND Performed at [facility], [address], [place], [place] [address]    Special Requests   Final    BOTTLES DRAWN AEROBIC AND ANAEROBIC Blood Culture  results may not be optimal due to an inadequate volume of blood received in culture bottles Performed at [facility], [address] [place], [place] [address]    Culture   Final    NO GROWTH < 24 HOURS Performed at [facility], [address] [place], [place] [address]    Report Status PENDING  Incomplete     Time coordinating discharge: 35 minutes  SIGNED:   Renato Applebaum, MD  [facility] 07/04/2024, 2:45 PM     [1]  Allergies Allergen Reactions   Penicillins Rash    Has patient had a PCN reaction causing immediate rash, facial/tongue/throat swelling, SOB or lightheadedness with hypotension: Yes Has patient had a PCN reaction causing severe rash involving mucus membranes or skin necrosis: No Has patient had a PCN reaction that required hospitalization: Yes Has patient had a PCN reaction occurring within the last 10 years: No If all of the above answers are NO, then may proceed with Cephalosporin use.

## 2024-07-04 NOTE — Progress Notes (Signed)
 PT Note  Patient Details Name: Dennis Griffin MRN: [id number] DOB: [birth year]   PT screen:    Reason Eval/Treat Not Completed: PT screened, no needs identified, will sign off. OT communicated with PT and stated pt is at baseline for functional mobility tasks. Thank you for this referral.   [place], PT Acute Rehab   [place] VEAR Drone 07/04/2024, 1:09 PM

## 2024-07-08 LAB — CULTURE, BLOOD (ROUTINE X 2)
Culture: NO GROWTH
Culture: NO GROWTH
Special Requests: ADEQUATE

## 2024-07-26 NOTE — Progress Notes (Signed)
 " Cardiology Office Note   Date:  08/07/2024  ID:  Dennis, Griffin 1930-09-12, MRN [id number] PCP: Yolande Toribio MATSU, MD  [facility] Providers Cardiologist:  Oneil Parchment, MD   History of Present Illness Dennis Griffin is a 89 y.o. male with a past medical history of HTN, HLD, degenerative disc disease in his lumbar spine, BPH. Patient presents today for follow up after hospital admission for syncope   Patient had presented the ED on 12/30 after an episode of syncope and collapse. He had been found on the floor by his wife. In the ED, patient was afibrile with BP 138/85. Ethanol level 127. Underwent echocardiogram that showed EF 70-75%, no wall motion abnormalities, mild LVH, grade I DD, normal RV systolic function, mild AI. On telemetry, patient had frequent ectopy and a 12 beat run of NSVT. Cardiology was consulted.  With cardiology, patient denied having any syncope. Agreed to wear cardiac monitor   Today, patient presents for hospital follow-up appointment.  Patient again tells me that he did not pass out prior to his admission.  He tells me that he was sitting in his armchair and knocked a lamp over.  He got out of his chair and kneeled on the ground to pick the lamp up and that was when his family called him and called EMS.  He denies ever having a syncopal episode.  He denies dizziness, near syncope.  He denies having chest pain or shortness of breath.  No palpitations or lower extremity swelling.  His blood pressure is very elevated today.  He denies ever being told that he had high blood pressure before.  He denies having headaches, vision changes, chest pain.  He is unable to feel that his blood pressure is elevated.  He is agreeable to starting amlodipine , though he does report that he does not like taking medications.  He follows regular with his primary care provider.  Last seen in December and had labs drawn at that time   Studies Reviewed Cardiac Studies & Procedures    ______________________________________________________________________________________________     ECHOCARDIOGRAM  ECHOCARDIOGRAM COMPLETE 07/04/2024  Narrative ECHOCARDIOGRAM REPORT    Patient Name:   Dennis Griffin Date of Exam: 07/04/2024 Medical Rec #:  [id number]       Height:       67.0 in Accession #:    [id number]      Weight:       170.0 lb Date of Birth:  Apr 18, 1931        BSA:          1.887 m Patient Age:    89 years        BP:           187/81 mmHg Patient Gender: M               HR:           66 bpm. Exam Location:  Inpatient  Procedure: 2D Echo, Cardiac Doppler and Color Doppler (Both Spectral and Color Flow Doppler were utilized during procedure).  Indications:    R55 Syncope  History:        Patient has no prior history of Echocardiogram examinations. Signs/Symptoms:Syncope; Risk Factors:Hypertension and Dyslipidemia. ETOH.  Sonographer:    Ellouise Mose RDCS Referring Phys: [id number] ALLISON WOLFE  IMPRESSIONS   1. Left ventricular ejection fraction, by estimation, is 70 to 75%. The left ventricle has hyperdynamic function. The left ventricle has no regional wall motion abnormalities. There  is mild left ventricular hypertrophy. Left ventricular diastolic parameters are consistent with Grade I diastolic dysfunction (impaired relaxation). 2. Right ventricular systolic function is normal. The right ventricular size is normal. Tricuspid regurgitation signal is inadequate for assessing PA pressure. The estimated right ventricular systolic pressure is 18.9 mmHg. 3. A small pericardial effusion is present. 4. The mitral valve is normal in structure. Trivial mitral valve regurgitation. No evidence of mitral stenosis. 5. The aortic valve is tricuspid. Aortic valve regurgitation is mild. Aortic valve sclerosis/calcification is present, without any evidence of aortic stenosis. 6. The inferior vena cava is dilated in size with >50% respiratory variability, suggesting right  atrial pressure of 8 mmHg.  Comparison(s): No prior Echocardiogram.  FINDINGS Left Ventricle: Left ventricular ejection fraction, by estimation, is 70 to 75%. The left ventricle has hyperdynamic function. The left ventricle has no regional wall motion abnormalities. The left ventricular internal cavity size was normal in size. There is mild left ventricular hypertrophy. Left ventricular diastolic parameters are consistent with Grade I diastolic dysfunction (impaired relaxation).  Right Ventricle: The right ventricular size is normal. Right ventricular systolic function is normal. Tricuspid regurgitation signal is inadequate for assessing PA pressure. The tricuspid regurgitant velocity is 1.65 m/s, and with an assumed right atrial pressure of 8 mmHg, the estimated right ventricular systolic pressure is 18.9 mmHg.  Left Atrium: Left atrial size was normal in size.  Right Atrium: Right atrial size was normal in size.  Pericardium: A small pericardial effusion is present.  Mitral Valve: The mitral valve is normal in structure. Trivial mitral valve regurgitation. No evidence of mitral valve stenosis.  Tricuspid Valve: The tricuspid valve is normal in structure. Tricuspid valve regurgitation is trivial. No evidence of tricuspid stenosis.  Aortic Valve: The aortic valve is tricuspid. Aortic valve regurgitation is mild. Aortic regurgitation PHT measures 381 msec. Aortic valve sclerosis/calcification is present, without any evidence of aortic stenosis. Aortic valve mean gradient measures 4.0 mmHg. Aortic valve peak gradient measures 6.8 mmHg. Aortic valve area, by VTI measures 3.90 cm.  Pulmonic Valve: The pulmonic valve was normal in structure. Pulmonic valve regurgitation is not visualized. No evidence of pulmonic stenosis.  Aorta: The aortic root is normal in size and structure.  Venous: The inferior vena cava is dilated in size with greater than 50% respiratory variability, suggesting right  atrial pressure of 8 mmHg.  IAS/Shunts: No atrial level shunt detected by color flow Doppler.   LEFT VENTRICLE PLAX 2D LVIDd:         5.10 cm     Diastology LVIDs:         2.80 cm     LV e' medial:    4.13 cm/s LV PW:         1.20 cm     LV E/e' medial:  19.5 LV IVS:        1.10 cm     LV e' lateral:   5.11 cm/s LVOT diam:     2.40 cm     LV E/e' lateral: 15.8 LV SV:         112 LV SV Index:   60 LVOT Area:     4.52 cm  LV Volumes (MOD) LV vol d, MOD A2C: 67.9 ml LV vol d, MOD A4C: 72.5 ml LV vol s, MOD A2C: 21.6 ml LV vol s, MOD A4C: 24.6 ml LV SV MOD A2C:     46.3 ml LV SV MOD A4C:     72.5 ml LV SV MOD  BP:      48.0 ml  RIGHT VENTRICLE             IVC RV S prime:     11.23 cm/s  IVC diam: 2.60 cm TAPSE (M-mode): 2.3 cm  LEFT ATRIUM             Index        RIGHT ATRIUM           Index LA diam:        4.60 cm 2.44 cm/m   RA Area:     13.30 cm LA Vol (A2C):   50.2 ml 26.60 ml/m  RA Volume:   26.40 ml  13.99 ml/m LA Vol (A4C):   36.7 ml 19.45 ml/m LA Biplane Vol: 47.2 ml 25.01 ml/m AORTIC VALVE AV Area (Vmax):    3.81 cm AV Area (Vmean):   3.76 cm AV Area (VTI):     3.90 cm AV Vmax:           130.00 cm/s AV Vmean:          89.300 cm/s AV VTI:            0.288 m AV Peak Grad:      6.8 mmHg AV Mean Grad:      4.0 mmHg LVOT Vmax:         109.50 cm/s LVOT Vmean:        74.150 cm/s LVOT VTI:          0.248 m LVOT/AV VTI ratio: 0.86 AI PHT:            381 msec  AORTA Ao Root diam: 3.75 cm Ao Asc diam:  3.75 cm  MITRAL VALVE                TRICUSPID VALVE MV Area (PHT): 2.95 cm     TR Peak grad:   10.9 mmHg MV Decel Time: 257 msec     TR Vmax:        165.00 cm/s MV E velocity: 80.50 cm/s MV A velocity: 120.00 cm/s  SHUNTS MV E/A ratio:  0.67         Systemic VTI:  0.25 m Systemic Diam: 2.40 cm  Redell Shallow MD Electronically signed by Redell Shallow MD Signature Date/Time: 07/04/2024/9:50:42 AM    Final           ______________________________________________________________________________________________      Risk Assessment/Calculations   HYPERTENSION CONTROL Vitals:   08/07/24 1332 08/07/24 1335  BP: (!) 195/83 (!) 181/78    The patient's blood pressure is elevated above target today.  In order to address the patient's elevated BP: A new medication was prescribed today.          Physical Exam VS:  BP (!) 181/78 (BP Location: Left Arm, Patient Position: Sitting, Cuff Size: Normal)   Pulse 87   Resp 16   Ht 5' 7 (1.702 m)   Wt 180 lb 11.2 oz (82 kg)   SpO2 95%   BMI 28.30 kg/m        Wt Readings from Last 3 Encounters:  08/07/24 180 lb 11.2 oz (82 kg)  07/04/23 170 lb (77.1 kg)  11/03/20 170 lb (77.1 kg)    GEN: Well nourished, well developed in no acute distress. Sitting comfortably on the exam table  NECK: No JVD  CARDIAC:  RRR, no murmurs, rubs, gallops RESPIRATORY:  Clear to auscultation without rales, wheezing or rhonchi. Normal WOB on room air  ABDOMEN: Soft, non-tender, non-distended EXTREMITIES:  No edema in BLE; No deformity   ASSESSMENT AND PLAN  Syncope (?)  - Patient had a fall on 07/03/24 and was brought to the ED for evaluation. Patient's wife reported syncope, but patient denied losing consciousness.  Today, patient continues to deny having a syncopal episode.  Reports that he had gotten out of his chair and was kneeling on the ground to pick something off the floor when his wife found him. - In the ED, ethanol 127. Patient was also initially hypothermic with temp 95.6 requiring bear hugger. Today, patient denies alcohol use   - Echocardiogram showed EF 70-75%, no wall motion abnormalities, mild AI  - Cardiac monitor after DC showed predominantly normal sinus rhythm with average HR 68 BPM, first degree AV block. There were 2 runs NSVT with the longest lasting 10 beats. 32 runs of SVT with the longest lasting 17 beats.  - Patient has not had any syncope,  near syncope, dizziness, palpitations since discharge. Tells me that he never has had issues with dizziness, near syncope, syncope in the past   HTN  - BP elevated to 181/78 today.  Patient denies ever being told he had high blood pressure before. - Reviewed recent vital signs.  Patient's BP is often in the 160s-180s systolic  - Start amlodipine  5 mg daily.  Instructed patient to check his blood pressure at home and keep a log.  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 No left carotid stenosis. Reviewed with vascular surgery- no acute intervention needed  -Patient denies having any vision changes, headaches, weakness - Continue simvastatin  20 mg daily  - With his age and lack of symptoms, anticipate medical management   Dispo: Follow up with APP in 3 weeks  Signed, Rollo FABIENE Louder, PA-C   "

## 2024-08-07 ENCOUNTER — Encounter: Payer: Self-pay | Admitting: Cardiology

## 2024-08-07 ENCOUNTER — Ambulatory Visit: Admitting: Cardiology

## 2024-08-07 VITALS — BP 181/78 | HR 87 | Resp 16 | Ht 67.0 in | Wt 180.7 lb

## 2024-08-07 DIAGNOSIS — I10 Essential (primary) hypertension: Secondary | ICD-10-CM

## 2024-08-07 DIAGNOSIS — E782 Mixed hyperlipidemia: Secondary | ICD-10-CM | POA: Diagnosis not present

## 2024-08-07 DIAGNOSIS — R55 Syncope and collapse: Secondary | ICD-10-CM

## 2024-08-07 DIAGNOSIS — I6521 Occlusion and stenosis of right carotid artery: Secondary | ICD-10-CM | POA: Diagnosis not present

## 2024-08-07 MED ORDER — AMLODIPINE BESYLATE 5 MG PO TABS: 5.0000 mg | ORAL_TABLET | Freq: Every day | ORAL | 3 refills | Status: AC
